# Patient Record
Sex: Male | Born: 1980 | Race: Black or African American | Hispanic: No | Marital: Single | Smoking: Former smoker
Health system: Southern US, Community
[De-identification: ages and names within clinical notes are randomized; demographics above are authoritative.]

## PROBLEM LIST (undated history)

## (undated) DIAGNOSIS — G44009 Cluster headache syndrome, unspecified, not intractable: Secondary | ICD-10-CM

---

## 2001-01-08 ENCOUNTER — Emergency Department (HOSPITAL_COMMUNITY): Admission: EM | Admit: 2001-01-08 | Discharge: 2001-01-08 | Payer: Self-pay | Admitting: Emergency Medicine

## 2005-01-04 ENCOUNTER — Emergency Department (HOSPITAL_COMMUNITY): Admission: EM | Admit: 2005-01-04 | Discharge: 2005-01-05 | Payer: Self-pay | Admitting: Emergency Medicine

## 2007-07-26 ENCOUNTER — Emergency Department: Payer: Self-pay | Admitting: Emergency Medicine

## 2008-09-23 ENCOUNTER — Emergency Department (HOSPITAL_COMMUNITY): Admission: EM | Admit: 2008-09-23 | Discharge: 2008-09-23 | Payer: Self-pay | Admitting: Emergency Medicine

## 2010-08-28 ENCOUNTER — Emergency Department (HOSPITAL_COMMUNITY): Payer: Self-pay

## 2010-08-28 ENCOUNTER — Emergency Department (HOSPITAL_COMMUNITY)
Admission: EM | Admit: 2010-08-28 | Discharge: 2010-08-29 | Disposition: A | Discharge status: Home / Self Care | Payer: Self-pay | Attending: Emergency Medicine | Admitting: Emergency Medicine

## 2010-08-28 DIAGNOSIS — H5789 Other specified disorders of eye and adnexa: Secondary | ICD-10-CM | POA: Insufficient documentation

## 2010-08-29 ENCOUNTER — Encounter (HOSPITAL_COMMUNITY): Payer: Self-pay | Admitting: Radiology

## 2010-09-04 ENCOUNTER — Emergency Department (HOSPITAL_COMMUNITY)
Admission: EM | Admit: 2010-09-04 | Discharge: 2010-09-04 | Disposition: A | Discharge status: Home / Self Care | Payer: Self-pay | Attending: Emergency Medicine | Admitting: Emergency Medicine

## 2010-09-04 DIAGNOSIS — R51 Headache: Secondary | ICD-10-CM | POA: Insufficient documentation

## 2010-09-04 DIAGNOSIS — R42 Dizziness and giddiness: Secondary | ICD-10-CM | POA: Insufficient documentation

## 2010-09-04 DIAGNOSIS — R5383 Other fatigue: Secondary | ICD-10-CM | POA: Insufficient documentation

## 2010-09-04 DIAGNOSIS — R5381 Other malaise: Secondary | ICD-10-CM | POA: Insufficient documentation

## 2013-08-18 ENCOUNTER — Encounter (HOSPITAL_COMMUNITY): Payer: Self-pay | Admitting: Emergency Medicine

## 2013-08-18 ENCOUNTER — Emergency Department (HOSPITAL_COMMUNITY)
Admission: EM | Admit: 2013-08-18 | Discharge: 2013-08-18 | Disposition: A | Discharge status: Home / Self Care | Payer: PRIVATE HEALTH INSURANCE | Attending: Emergency Medicine | Admitting: Emergency Medicine

## 2013-08-18 DIAGNOSIS — M542 Cervicalgia: Secondary | ICD-10-CM | POA: Insufficient documentation

## 2013-08-18 DIAGNOSIS — G44009 Cluster headache syndrome, unspecified, not intractable: Secondary | ICD-10-CM | POA: Insufficient documentation

## 2013-08-18 DIAGNOSIS — Z79899 Other long term (current) drug therapy: Secondary | ICD-10-CM | POA: Insufficient documentation

## 2013-08-18 DIAGNOSIS — F17200 Nicotine dependence, unspecified, uncomplicated: Secondary | ICD-10-CM | POA: Insufficient documentation

## 2013-08-18 HISTORY — DX: Cluster headache syndrome, unspecified, not intractable: G44.009

## 2013-08-18 MED ORDER — KETOROLAC TROMETHAMINE 30 MG/ML IJ SOLN: 30.0000 mg | Freq: Once | INTRAMUSCULAR | Status: DC

## 2013-08-18 MED ORDER — SUMATRIPTAN SUCCINATE 50 MG PO TABS: 50.0000 mg | ORAL_TABLET | Freq: Once | ORAL | Status: DC

## 2013-08-18 MED ORDER — SUMATRIPTAN 20 MG/ACT NA SOLN
20.0000 mg | NASAL | Status: DC | PRN
  Administered 2013-08-18: 20 mg via NASAL
  Filled 2013-08-18 (×2): qty 1

## 2013-08-18 MED ORDER — SUMATRIPTAN SUCCINATE 6 MG/0.5ML ~~LOC~~ SOLN: 6.0000 mg | Freq: Once | SUBCUTANEOUS | Status: DC

## 2013-08-18 MED ORDER — SODIUM CHLORIDE 0.9 % IV BOLUS (SEPSIS)
1000.0000 mL | INTRAVENOUS | Status: AC
Start: 2013-08-18 — End: 2013-08-18
  Administered 2013-08-18: 1000 mL via INTRAVENOUS

## 2013-08-18 NOTE — ED Notes (Signed)
The pt has had  A headache since last Sunday.  He has a hx of headaches.  Nausea no vomiting or diarrhea

## 2013-08-18 NOTE — ED Provider Notes (Signed)
CSN: [id number]     Arrival date & time 08/18/13  1827 History   First MD Initiated Contact with Patient 08/18/13 1957     Chief Complaint  Patient presents with  . Headache   (Consider location/radiation/quality/duration/timing/severity/associated sxs/prior Treatment) HPI Pt is a 33yo male with hx of cluster headaches presenting today with right sided head pain that started 5 days ago, gradually worsening. Pt states pain is pressure like and stabbing at times, worse on right side, 10/10. Pain starts behind both eyes and feels like there is swelling around his eyes. Reports watering of eyes, worse on right side as well as rhinorrhea.  Has tried ibuprofen and BC powders with minimal relief. Also reports nausea but no fever, vomiting or diarrhea. Denies head trauma or syncope. Denies change in vision. Reports similar headaches 2 years ago that lasted up to 2 weeks, another episode last year that lasted about 3 days and now today. Has not f/u with PCP or neurology for headaches. Does not have a PCP. Denies hx of HTN.  Past Medical History  Diagnosis Date  . Cluster headache    History reviewed. No pertinent past surgical history. History reviewed. No pertinent family history. History  Substance Use Topics  . Smoking status: Current Every Day Smoker  . Smokeless tobacco: Not on file  . Alcohol Use: Yes    Review of Systems  Constitutional: Negative for fever and chills.  HENT: Positive for rhinorrhea. Negative for congestion and sinus pressure.   Eyes: Positive for pain ( pressure).       Eye watering  Respiratory: Negative for shortness of breath.   Cardiovascular: Negative for chest pain.  Gastrointestinal: Positive for nausea. Negative for vomiting and abdominal pain.  Musculoskeletal: Positive for neck pain ( left side). Negative for neck stiffness.  Neurological: Positive for headaches. Negative for dizziness and light-headedness.  All other systems reviewed and are  negative.    Allergies  Review of patient's allergies indicates no known allergies.  Home Medications   Current Outpatient Rx  Name  Route  Sig  Dispense  Refill  . SUMAtriptan (IMITREX) 50 MG tablet   Oral   Take 1 tablet (50 mg total) by mouth once. May repeat in 2 hours if headache persists or recurs.   3 tablet   0    BP 134/78  Pulse 78  Temp(Src) 98.2 F (36.8 C)  Resp 12  Ht 5\' 7"  (1.702 m)  Wt 164 lb (74.39 kg)  BMI 25.68 kg/m2  SpO2 97% Physical Exam  Nursing note and vitals reviewed. Constitutional: He is oriented to person, place, and time. He appears well-developed and well-nourished.  Pt lying comfortably in exam bed, NAD.  HENT:  Head: Normocephalic and atraumatic.  Eyes: Conjunctivae and EOM are normal. Pupils are equal, round, and reactive to light. Right eye exhibits no discharge. Left eye exhibits no discharge. No scleral icterus.  Neck: Normal range of motion. Neck supple.  No nuchal rigidity or meningeal signs.  Cardiovascular: Normal rate, regular rhythm and normal heart sounds.   Pulmonary/Chest: Effort normal and breath sounds normal. No respiratory distress. He has no wheezes. He has no rales. He exhibits no tenderness.  Abdominal: Soft. Bowel sounds are normal. He exhibits no distension and no mass. There is no tenderness. There is no rebound and no guarding.  Musculoskeletal: Normal range of motion.  Neurological: He is alert and oriented to person, place, and time. He has normal strength. No cranial nerve deficit or sensory  deficit. Coordination and gait normal. GCS eye subscore is 4. GCS verbal subscore is 5. GCS motor subscore is 6.  CN II-XII grossly in tact. No ataxia. Normal gait. 5/5 grip strength.  Skin: Skin is warm and dry.    ED Course  Procedures (including critical care time) Labs Review Labs Reviewed - No data to display Imaging Review No results found.  EKG Interpretation   None       MDM   1. Cluster headache     Pt with hx of cluster presenting with similar headache. Reviewed medical records which showed negative head CT upon initial encounter for similar headache. Neuro exam: normal.  PERRL.  Tx in ED: fluids, oxygen via [place], imitrex.  HA improved in ED. Pt discharged home. [facility]. Rx: sumatriptan 3 tabs.  Return precautions provided. Pt verbalized understanding and agreement with tx plan.     Junius Finnerrin O'Malley, PA-C 08/18/13 2315

## 2013-08-18 NOTE — ED Provider Notes (Signed)
Medical screening examination/treatment/procedure(s) were performed by non-physician practitioner and as supervising physician I was immediately available for consultation/collaboration.     Geoffery Lyonsouglas Burlon Centrella, MD 08/18/13 [phone]

## 2013-08-18 NOTE — Discharge Instructions (Signed)
Cluster Headache  Cluster headaches are recognized by their pattern of deep, intense head pain. They normally occur on one side of your head, but they may "switch sides" in subsequent episodes. Typically, cluster headaches:   · Are severe in nature.    · Occur repeatedly over weeks to months and are followed by periods of no headaches.    · Can last from 15 minutes to 3 hours.    · Occur at the same time each day, often at night.    · Occur several times a day.  CAUSES  The exact cause of cluster headaches is not known. Alcohol use may be associated with cluster headaches.  SIGNS AND SYMPTOMS   · Severe pain that begins in or around your eye or temple.    · One-sided head pain.    · Feeling sick to your stomach (nauseous).    · Sensitivity to light.    · Runny nose.    · Eye redness, tearing, and nasal stuffiness on the side of your head where you are experiencing pain.    · Sweaty, pale skin of the face.    · Droopy or swollen eyelid.    · Restlessness.  DIAGNOSIS   Cluster headaches are diagnosed based on symptoms and a physical exam. Your health care provider may order a CT scan or an MRI of your head or lab tests to see if your headaches are caused by other medical conditions.   TREATMENT   · Medicines for pain relief and to prevent recurrent attacks. Some people may need a combination of medicines.  · Oxygen for pain relief.    · Biofeedback programs to help reduce headache pain.    It may be helpful to keep a headache diary. This may help you find a trend for what is triggering your headaches. Your health care provider can develop a treatment plan.   HOME CARE INSTRUCTIONS   During cluster periods:   · Follow a regular sleep schedule. Do not vary the amount and time that you sleep from day to day. It is important to stay on the same schedule during a cluster period to help prevent headaches.    · Avoid alcohol.    · Stop smoking if you smoke.    SEEK MEDICAL CARE IF:  · You have any changes from your previous  cluster headaches either in intensity or frequency.    · You are not getting relief from medicines you are taking.    SEEK IMMEDIATE MEDICAL CARE IF:   · You faint.    · You have weakness or numbness, especially on one side of your body or face.    · You have double vision.    · You have nausea or vomiting that is not relieved within several hours.    · You cannot keep your balance or have difficulty talking or walking.    · You have neck pain or stiffness.    · You have a fever.  MAKE SURE YOU:  · Understand these instructions.    · Will watch your condition.    · Will get help right away if you are not doing well or get worse.  Document Released: 07/13/2005 Document Revised: 05/03/2013 Document Reviewed: 02/02/2013  [employer]® Patient Information ©2014 [employer].

## 2013-09-22 ENCOUNTER — Ambulatory Visit: Payer: PRIVATE HEALTH INSURANCE | Admitting: Neurology

## 2013-10-13 ENCOUNTER — Encounter: Payer: Self-pay | Admitting: Neurology

## 2013-10-13 ENCOUNTER — Ambulatory Visit (INDEPENDENT_AMBULATORY_CARE_PROVIDER_SITE_OTHER): Payer: PRIVATE HEALTH INSURANCE | Admitting: Neurology

## 2013-10-13 VITALS — BP 124/68 | HR 74 | Temp 97.6°F | Resp 18 | Ht 68.0 in | Wt 164.7 lb

## 2013-10-13 DIAGNOSIS — G44009 Cluster headache syndrome, unspecified, not intractable: Secondary | ICD-10-CM

## 2013-10-13 NOTE — Progress Notes (Signed)
NEUROLOGY CONSULTATION NOTE  Michael Ramirez MRN: [id number] DOB: 13-Aug-1980  Referring provider: Dr. Geoffery Lyonsouglas Delo, MD (ED referral) Primary care provider: N/A  Reason for consult:  Headache.  HISTORY OF PRESENT ILLNESS: Michael Ramirez is a 33 year old right-handed man with history of cluster headaches presenting for cluster headache.  Records and images were personally reviewed where available.    Onset:  Started 4 years ago. Location:  Right retro-orbital. Quality:  Difficult to describe, but very severe and debilitating. Intensity:  "20"/10. Associated symptoms:  Bilateral conjunctival injection.  Some mild nausea. No vomiting. Duration:  One hour. Frequency:   Several times a day. Usually occurs daily for a period of 3 weeks. Usually occurs during the winter, at the end of the year or beginning of the year. Activity:  Unable to move or do anything.  Past abortive therapy:  O2 in ED (effective).  Sumatriptan po in ED (not sure if effective because it was given at an hour after onset).  Vicodin (feels weird) Past preventative therapy:  none   Current abortive therapy:  Current episodes have resolved.  But typically takes Aleve several times a day. Current preventative therapy:  None.  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 08/28/10 CT of head performed for these headaches.  Personally reviewed and normal.  No prior history of headache.  No family history of headache or cerebral aneurysms.  PAST MEDICAL HISTORY: Past Medical History  Diagnosis Date  . Cluster headache     PAST SURGICAL HISTORY: No past surgical history on file.  MEDICATIONS: Current Outpatient Prescriptions on File Prior to Visit  Medication Sig Dispense Refill  . SUMAtriptan (IMITREX) 50 MG tablet Take 1 tablet (50 mg total) by mouth once. May repeat in 2 hours if headache persists or recurs.  3 tablet  0   No current facility-administered medications on file prior to visit.     ALLERGIES: No Known Allergies  FAMILY HISTORY: Family History  Problem Relation Age of Onset  . Hypertension Father     SOCIAL HISTORY: History   Social History  . Marital Status: Married    Spouse Name: N/A    Number of Children: N/A  . Years of Education: N/A   Occupational History  . Not on file.   Social History Main Topics  . Smoking status: Current Every Day Smoker  . Smokeless tobacco: Not on file  . Alcohol Use: Yes     Comment: Fifth  every other weekend   . Drug Use: Not on file  . Sexual Activity: Yes    Partners: Female    Birth Control/ Protection: Condom   Other Topics Concern  . Not on file   Social History Narrative  . No narrative on file    REVIEW OF SYSTEMS: Constitutional: No fevers, chills, or sweats, no generalized fatigue, change in appetite Eyes: No visual changes, double vision, eye pain Ear, nose and throat: No hearing loss, ear pain, nasal congestion, sore throat Cardiovascular: No chest pain, palpitations Respiratory:  No shortness of breath at rest or with exertion, wheezes GastrointestinaI: No nausea, vomiting, diarrhea, abdominal pain, fecal incontinence Genitourinary:  No dysuria, urinary retention or frequency Musculoskeletal:  No neck pain, back pain Integumentary: No rash, pruritus, skin lesions Neurological: as above Psychiatric: No depression, insomnia, anxiety Endocrine: No palpitations, fatigue, diaphoresis, mood swings, change in appetite, change in weight, increased thirst Hematologic/Lymphatic:  No anemia, purpura, petechiae. Allergic/Immunologic: no itchy/runny eyes, nasal congestion, recent allergic reactions, rashes  PHYSICAL EXAM: Filed Vitals:   10/13/13 0755  BP: 124/68  Pulse: 74  Temp: 97.6 F (36.4 C)  Resp: 18   General: No acute distress Head:  Normocephalic/atraumatic Neck: supple, no paraspinal tenderness, full range of motion Back: No paraspinal tenderness Heart: regular rate and  rhythm Lungs: Clear to auscultation bilaterally. Vascular: No carotid bruits. Neurological Exam: Mental status: alert and oriented to person, place, and time, recent and remote memory intact, fund of knowledge intact, attention and concentration intact, speech fluent and not dysarthric, language intact. Cranial nerves: CN I: not tested CN II: pupils equal, round and reactive to light, visual fields intact, fundi unremarkable, without vessel changes, exudates, hemorrhages or papilledema. CN III, IV, VI:  full range of motion, no nystagmus, no ptosis CN V: facial sensation intact CN VII: upper and lower face symmetric CN VIII: hearing intact CN IX, X: gag intact, uvula midline CN XI: sternocleidomastoid and trapezius muscles intact CN XII: tongue midline Bulk & Tone: normal, no fasciculations. Motor: 5/5 throughout Sensation:  Temperature and vibration intact. Deep Tendon Reflexes: 2+ throughout, toes down. Finger to nose testing: No dysmetria. Heel to shin: No dysmetria. Gait: Normal station and stride. Able to turn and walk in tandem. Romberg negative.  IMPRESSION: Cluster headaches.  PLAN: Since his clusters of last 3 weeks and are unlikely to return for another year, I wouldn't start a preventative medication at this time. If and when another cluster begins, we can try Imitrex 6 mg nasal. I would still like to see him in 6 months for reevaluation.  45 minutes spent with the patient, over 50% spent counseling and coordinating care.  Thank you for allowing me to take part in the care of this patient.  Shon Millet, DO

## 2013-10-13 NOTE — Patient Instructions (Signed)
It sounds like you have cluster headaches.  I wouldn't start any medications at this time, since the headache episode has passed.  If or when you have another episode, call and we can prescribe a medication to stop it.  I would like to see you in 6 months for a re-evaluation.  Call sooner with questions or concerns.

## 2014-04-16 ENCOUNTER — Ambulatory Visit: Payer: PRIVATE HEALTH INSURANCE | Admitting: Neurology

## 2014-04-17 ENCOUNTER — Telehealth: Payer: Self-pay | Admitting: Neurology

## 2014-04-17 NOTE — Telephone Encounter (Signed)
Pt no showed 6 month follow up scheduled for 04/16/14 w/ Dr. Everlena Cooper. No show letter mailed to pt / Sherri S.

## 2016-02-11 ENCOUNTER — Ambulatory Visit: Payer: PRIVATE HEALTH INSURANCE | Admitting: Neurology

## 2016-09-17 ENCOUNTER — Ambulatory Visit (INDEPENDENT_AMBULATORY_CARE_PROVIDER_SITE_OTHER): Payer: PRIVATE HEALTH INSURANCE | Admitting: Family Medicine

## 2016-09-17 ENCOUNTER — Other Ambulatory Visit (HOSPITAL_COMMUNITY)
Admission: RE | Admit: 2016-09-17 | Discharge: 2016-09-17 | Disposition: A | Discharge status: Home / Self Care | Payer: 59 | Source: Ambulatory Visit | Attending: Family Medicine | Admitting: Family Medicine

## 2016-09-17 ENCOUNTER — Encounter: Payer: Self-pay | Admitting: Family Medicine

## 2016-09-17 VITALS — BP 108/78 | HR 60 | Temp 97.8°F | Ht 68.0 in | Wt 177.0 lb

## 2016-09-17 DIAGNOSIS — Z113 Encounter for screening for infections with a predominantly sexual mode of transmission: Secondary | ICD-10-CM | POA: Diagnosis not present

## 2016-09-17 DIAGNOSIS — Z0000 Encounter for general adult medical examination without abnormal findings: Secondary | ICD-10-CM

## 2016-09-17 DIAGNOSIS — Z7689 Persons encountering health services in other specified circumstances: Secondary | ICD-10-CM

## 2016-09-17 DIAGNOSIS — K649 Unspecified hemorrhoids: Secondary | ICD-10-CM | POA: Diagnosis not present

## 2016-09-17 LAB — HEPATIC FUNCTION PANEL
ALK PHOS: 68 U/L (ref 39–117)
ALT: 19 U/L (ref 0–53)
AST: 17 U/L (ref 0–37)
Albumin: 4.4 g/dL (ref 3.5–5.2)
BILIRUBIN DIRECT: 0.1 mg/dL (ref 0.0–0.3)
Total Bilirubin: 0.4 mg/dL (ref 0.2–1.2)
Total Protein: 6.8 g/dL (ref 6.0–8.3)

## 2016-09-17 LAB — CBC WITH DIFFERENTIAL/PLATELET
BASOS ABS: 0 10*3/uL (ref 0.0–0.1)
Basophils Relative: 0.6 % (ref 0.0–3.0)
EOS ABS: 0.2 10*3/uL (ref 0.0–0.7)
Eosinophils Relative: 3.8 % (ref 0.0–5.0)
HEMATOCRIT: 44.7 % (ref 39.0–52.0)
Hemoglobin: 15.1 g/dL (ref 13.0–17.0)
LYMPHS ABS: 2.1 10*3/uL (ref 0.7–4.0)
LYMPHS PCT: 45.7 % (ref 12.0–46.0)
MCHC: 33.7 g/dL (ref 30.0–36.0)
MCV: 81.1 fl (ref 78.0–100.0)
Monocytes Absolute: 0.6 10*3/uL (ref 0.1–1.0)
Monocytes Relative: 12.6 % — ABNORMAL HIGH (ref 3.0–12.0)
NEUTROS ABS: 1.7 10*3/uL (ref 1.4–7.7)
NEUTROS PCT: 37.3 % — AB (ref 43.0–77.0)
PLATELETS: 242 10*3/uL (ref 150.0–400.0)
RBC: 5.51 Mil/uL (ref 4.22–5.81)
RDW: 14.6 % (ref 11.5–15.5)
WBC: 4.6 10*3/uL (ref 4.0–10.5)

## 2016-09-17 LAB — LIPID PANEL
CHOL/HDL RATIO: 5
Cholesterol: 231 mg/dL — ABNORMAL HIGH (ref 0–200)
HDL: 42.3 mg/dL (ref >39.00)
LDL CALC: 163 mg/dL — AB (ref 0–99)
NonHDL: 188.2
TRIGLYCERIDES: 126 mg/dL (ref 0.0–149.0)
VLDL: 25.2 mg/dL (ref 0.0–40.0)

## 2016-09-17 LAB — BASIC METABOLIC PANEL
BUN: 14 mg/dL (ref 6–23)
CALCIUM: 9.4 mg/dL (ref 8.4–10.5)
CO2: 27 meq/L (ref 19–32)
CREATININE: 1.35 mg/dL (ref 0.40–1.50)
Chloride: 105 mEq/L (ref 96–112)
GFR: 77.4 mL/min (ref >60.00)
Glucose, Bld: 89 mg/dL (ref 70–99)
Potassium: 4.2 mEq/L (ref 3.5–5.1)
SODIUM: 139 meq/L (ref 135–145)

## 2016-09-17 LAB — POCT URINALYSIS DIPSTICK
BILIRUBIN UA: NEGATIVE
GLUCOSE UA: NEGATIVE
KETONES UA: NEGATIVE
Nitrite, UA: NEGATIVE
Urobilinogen, UA: 0.2
pH, UA: 5.5

## 2016-09-17 LAB — TSH: TSH: 1.13 u[IU]/mL (ref 0.35–4.50)

## 2016-09-17 NOTE — Progress Notes (Signed)
Pre visit review using our clinic review tool, if applicable. No additional management support is needed unless otherwise documented below in the visit note. 

## 2016-09-17 NOTE — Progress Notes (Signed)
Patient ID: Michael Ramirez, male   DOB: 10/26/1980, 36 y.o.   MRN: [id number]  Patient presents to clinic today to establish and receive routine care. He reports that is has "been a long time" since he has seen a provider for a physical   No acute symptoms noted today. He is a former smoker who quit 11/25/2015 an he reports drinking a "fifth of liquor" every week which is mainly contained to the weekend.  Reviewed health maintenance protocols including  colonoscopy, and reviewed appropriate screening labs.  Immunization history was reviewed as well as his current medications and allergies.   Hemorrhoids: History of hemorrhoids that are not causing any pain today.  He reports last flare approximately 1 year ago which he treated with witch hazel that provided excellent benefit.  He denies N/V, constipation, loose stools, melena, or narrow stool caliber. He also reports eating a diet that consists of mainly fried foods with limited fiber intake. Reports BM frequency every other day.  He is requesting screening for STIs. He denies any new partners and reports condom use. He further denies any dysuria, hematuria, or penile discharge today.   Health Maintenance: Dental -- Every 6 months Vision -- Yearly Immunizations -- Declined influenza vaccine; Recommend tetanus   Past Medical History:  Diagnosis Date  . Cluster headache     History reviewed. No pertinent surgical history.  No current outpatient prescriptions on file prior to visit.   No current facility-administered medications on file prior to visit.     No Known Allergies  Family History  Problem Relation Age of Onset  . Hypertension Father   . Heart disease Paternal Grandmother     Social History   Social History  . Marital status: Single    Spouse name: N/A  . Number of children: N/A  . Years of education: N/A   Occupational History  . Not on file.   Social History Main Topics  . Smoking status: Former Smoker    Quit  date: 11/25/2015  . Smokeless tobacco: Former NeurosurgeonUser  . Alcohol use Yes     Comment: Fifth  every other weekend   . Drug use: No  . Sexual activity: Yes    Partners: Female    Birth control/ protection: Condom   Other Topics Concern  . Not on file   Social History Narrative  . No narrative on file    Review of Systems  Constitutional: Negative for chills, diaphoresis and fever.  HENT: Negative for congestion, ear pain, sinus pain and sore throat.   Eyes: Negative for blurred vision and double vision.  Respiratory: Negative for cough, hemoptysis, sputum production, shortness of breath and wheezing.   Cardiovascular: Negative for chest pain and palpitations.  Gastrointestinal: Negative for abdominal pain, blood in stool, constipation, diarrhea, heartburn, melena, nausea and vomiting.  Genitourinary: Negative for dysuria, flank pain, frequency, hematuria and urgency.  Musculoskeletal: Negative for back pain, joint pain and myalgias.  Skin: Negative for rash.  Neurological: Negative for dizziness, tingling and headaches.  Endo/Heme/Allergies: Negative for polydipsia. Does not bruise/bleed easily.  Psychiatric/Behavioral:       Denies depressed or anxious mood    BP 108/78 (BP Location: Left Arm, Patient Position: Sitting, Cuff Size: Normal)   Pulse 60   Temp 97.8 F (36.6 C) (Oral)   Ht 5\' 8"  (1.727 m)   Wt 177 lb (80.3 kg)   SpO2 98%   BMI 26.91 kg/m   Physical Exam  Physical Exam  Constitutional:  He is oriented to person, place, and time. He appears well-developed and well-nourished. No distress.  HENT:  Head: Normocephalic and atraumatic.  Right Ear: Tympanic membrane and ear canal normal.  Left Ear: Tympanic membrane and ear canal normal.  Mouth/Throat: Oropharynx is clear and moist.  Eyes: Pupils are equal, round, and reactive to light. No scleral icterus.  Neck: Normal range of motion. No thyromegaly present.  Cardiovascular: Normal rate and regular rhythm.   No  murmur heard. Pulmonary/Chest: Effort normal and breath sounds normal. No respiratory distress. He has no wheezes. He has no rales. He exhibits no tenderness.  Abdominal: Soft. Bowel sounds are normal. He exhibits no distension and no mass. There is no tenderness. There is no rebound and no guarding.  Musculoskeletal: He exhibits no edema.  Lymphadenopathy:    He has no cervical adenopathy.  Neurological: He is alert and oriented to person, place, and time. He has normal reflexes. He exhibits normal muscle tone. Coordination normal.  Skin: Skin is warm and dry.  Psychiatric: He has a normal mood and affect. His behavior is normal. Judgment and thought content normal.   He denies GU exam today  Assessment/Plan: 1. Routine general medical examination at a health care facility 36 y.o. male presenting for annual physical.  Health Maintenance counseling: 1. Anticipatory guidance: Patient counseled regarding regular dental exams, eye exams, wearing seatbelts.  2. Risk factor reduction:  Advised patient of need for regular exercise and diet rich and fruits and vegetables to reduce risk of heart attack and stroke.  3. Immunizations/screenings/ancillary studies. He declined influenza and Tdap today.  There is no immunization history on file for this patient. Health Maintenance Due  Topic Date Due  . HIV Screening  10/30/1996  . TETANUS/TDAP  10/30/2000  . INFLUENZA VACCINE  02/25/2016   4. Prostate cancer screening-Denied GU exam today  5. Colon cancer screening - Not needed; denies GU exam today 6. Skin cancer screening- No suspicious lesions noted on arms, legs, back, or chest.  - Basic metabolic panel - CBC with Differential/Platelet - Hepatic function panel - TSH - Lipid panel - POCT urinalysis dipstick  2. Routine screening for STI (sexually transmitted infection) No symptoms noted; denies new sexual partners and reports condom use. Denies GU exam today. Will provide lab screening  tests today. Empiric treatment denied today.  - Urine cytology ancillary only - HIV antibody - RPR - Hepatitis C antibody  3. Encounter to establish care We reviewed the PMH, PSH, FH, SH, Meds and Allergies. --We addressed current concerns per orders and patient instructions. -We have asked for records for pertinent exams, studies, vaccines and notes from previous providers. -We have advised patient to follow up per instructions below.   4. Hemorrhoids, unspecified hemorrhoid type Asymptomatic today; denies visual exam. Advised to follow up if a flare occurs and evaluation and treatment can be provided.  Follow up in one year or sooner if needed for symptoms or lab findings.  Roddie Mc, FNP-C

## 2016-09-17 NOTE — Patient Instructions (Signed)
It was a pleasure to meet you today! We have ordered labs or studies at this visit. It can take up to 1-2 weeks for results and processing. IF results require follow up or explanation, we will call you with instructions. Clinically stable results will be released to your [facility]. If you have not heard from [place] or cannot find your results in [facility] in 2 weeks please contact our office at [phone].  If you are not yet signed up for [facility], please consider signing up   Health Maintenance, Male A healthy lifestyle and preventative care can promote health and wellness.  Maintain regular health, dental, and eye exams.  Eat a healthy diet. Foods like vegetables, fruits, whole grains, low-fat dairy products, and lean protein foods contain the nutrients you need and are low in calories. Decrease your intake of foods high in solid fats, added sugars, and salt. Get information about a proper diet from your health care provider, if necessary.  Regular physical exercise is one of the most important things you can do for your health. Most adults should get at least 150 minutes of moderate-intensity exercise (any activity that increases your heart rate and causes you to sweat) each week. In addition, most adults need muscle-strengthening exercises on 2 or more days a week.   Maintain a healthy weight. The body mass index (BMI) is a screening tool to identify possible weight problems. It provides an estimate of body fat based on height and weight. Your health care provider can find your BMI and can help you achieve or maintain a healthy weight. For males 20 years and older:  A BMI below 18.5 is considered underweight.  A BMI of 18.5 to 24.9 is normal.  A BMI of 25 to 29.9 is considered overweight.  A BMI of 30 and above is considered obese.  Maintain normal blood lipids and cholesterol by exercising and minimizing your intake of saturated fat. Eat a balanced diet with plenty of fruits and vegetables.  Blood tests for lipids and cholesterol should begin at age 93 and be repeated every 5 years. If your lipid or cholesterol levels are high, you are over age 44, or you are at high risk for heart disease, you may need your cholesterol levels checked more frequently.Ongoing high lipid and cholesterol levels should be treated with medicines if diet and exercise are not working.  If you smoke, find out from your health care provider how to quit. If you do not use tobacco, do not start.  Lung cancer screening is recommended for adults aged 55-80 years who are at high risk for developing lung cancer because of a history of smoking. A yearly low-dose CT scan of the lungs is recommended for people who have at least a 30-pack-year history of smoking and are current smokers or have quit within the past 15 years. A pack year of smoking is smoking an average of 1 pack of cigarettes a day for 1 year (for example, a 30-pack-year history of smoking could mean smoking 1 pack a day for 30 years or 2 packs a day for 15 years). Yearly screening should continue until the smoker has stopped smoking for at least 15 years. Yearly screening should be stopped for people who develop a health problem that would prevent them from having lung cancer treatment.  If you choose to drink alcohol, do not have more than 2 drinks per day. One drink is considered to be 12 oz (360 mL) of beer, 5 oz (150 mL)  of wine, or 1.5 oz (45 mL) of liquor.  Avoid the use of street drugs. Do not share needles with anyone. Ask for help if you need support or instructions about stopping the use of drugs.  High blood pressure causes heart disease and increases the risk of stroke. High blood pressure is more likely to develop in:  People who have blood pressure in the end of the normal range (100-139/85-89 mm Hg).  People who are overweight or obese.  People who are African American.  If you are 3818-35 years of age, have your blood pressure checked  every 3-5 years. If you are 35 years of age or older, have your blood pressure checked every year. You should have your blood pressure measured twice-once when you are at a hospital or clinic, and once when you are not at a hospital or clinic. Record the average of the two measurements. To check your blood pressure when you are not at a hospital or clinic, you can use:  An automated blood pressure machine at a pharmacy.  A home blood pressure monitor.  If you are 3145-35 years old, ask your health care provider if you should take aspirin to prevent heart disease.  Diabetes screening involves taking a blood sample to check your fasting blood sugar level. This should be done once every 3 years after age 35 if you are at a normal weight and without risk factors for diabetes. Testing should be considered at a younger age or be carried out more frequently if you are overweight and have at least 1 risk factor for diabetes.  Colorectal cancer can be detected and often prevented. Most routine colorectal cancer screening begins at the age of 35 and continues through age 275. However, your health care provider may recommend screening at an earlier age if you have risk factors for colon cancer. On a yearly basis, your health care provider may provide home test kits to check for hidden blood in the stool. A small camera at the end of a tube may be used to directly examine the colon (sigmoidoscopy or colonoscopy) to detect the earliest forms of colorectal cancer. Talk to your health care provider about this at age 550 when routine screening begins. A direct exam of the colon should be repeated every 5-10 years through age 35, unless early forms of precancerous polyps or small growths are found.  People who are at an increased risk for hepatitis B should be screened for this virus. You are considered at high risk for hepatitis B if:  You were born in a country where hepatitis B occurs often. Talk with your health care  provider about which countries are considered high risk.  Your parents were born in a high-risk country and you have not received a shot to protect against hepatitis B (hepatitis B vaccine).  You have HIV or AIDS.  You use needles to inject street drugs.  You live with, or have sex with, someone who has hepatitis B.  You are a man who has sex with other men (MSM).  You get hemodialysis treatment.  You take certain medicines for conditions like cancer, organ transplantation, and autoimmune conditions.  Hepatitis C blood testing is recommended for all people born from [id number] through 1965 and any individual with known risk factors for hepatitis C.  Healthy men should no longer receive prostate-specific antigen (PSA) blood tests as part of routine cancer screening. Talk to your health care provider about prostate cancer screening.  Testicular cancer  screening is not recommended for adolescents or adult males who have no symptoms. Screening includes self-exam, a health care provider exam, and other screening tests. Consult with your health care provider about any symptoms you have or any concerns you have about testicular cancer.  Practice safe sex. Use condoms and avoid high-risk sexual practices to reduce the spread of sexually transmitted infections (STIs).  You should be screened for STIs, including gonorrhea and chlamydia if:  You are sexually active and are younger than 24 years.  You are older than 24 years, and your health care provider tells you that you are at risk for this type of infection.  Your sexual activity has changed since you were last screened, and you are at an increased risk for chlamydia or gonorrhea. Ask your health care provider if you are at risk.  If you are at risk of being infected with HIV, it is recommended that you take a prescription medicine daily to prevent HIV infection. This is called pre-exposure prophylaxis (PrEP). You are considered at risk if:  You  are a man who has sex with other men (MSM).  You are a heterosexual man who is sexually active with multiple partners.  You take drugs by injection.  You are sexually active with a partner who has HIV.  Talk with your health care provider about whether you are at high risk of being infected with HIV. If you choose to begin PrEP, you should first be tested for HIV. You should then be tested every 3 months for as long as you are taking PrEP.  Use sunscreen. Apply sunscreen liberally and repeatedly throughout the day. You should seek shade when your shadow is shorter than you. Protect yourself by wearing long sleeves, pants, a wide-brimmed hat, and sunglasses year round whenever you are outdoors.  Tell your health care provider of new moles or changes in moles, especially if there is a change in shape or color. Also, tell your health care provider if a mole is larger than the size of a pencil eraser.  A one-time screening for abdominal aortic aneurysm (AAA) and surgical repair of large AAAs by ultrasound is recommended for men aged 65-75 years who are current or former smokers.  Stay current with your vaccines (immunizations). This information is not intended to replace advice given to you by your health care provider. Make sure you discuss any questions you have with your health care provider. Document Released: 01/09/2008 Document Revised: 08/03/2014 Document Reviewed: 04/16/2015 Elsevier Interactive Patient Education  2017 [employer].

## 2016-09-18 LAB — URINE CYTOLOGY ANCILLARY ONLY
Chlamydia: NEGATIVE
Neisseria Gonorrhea: NEGATIVE
TRICH (WINDOWPATH): NEGATIVE

## 2016-09-18 LAB — HEPATITIS C ANTIBODY: HCV AB: NEGATIVE

## 2016-09-18 LAB — RPR

## 2016-09-18 LAB — HIV ANTIBODY (ROUTINE TESTING W REFLEX): HIV 1&2 Ab, 4th Generation: NONREACTIVE

## 2016-09-21 LAB — URINE CYTOLOGY ANCILLARY ONLY: BACTERIAL VAGINITIS: NEGATIVE

## 2017-08-11 DIAGNOSIS — H18212 Corneal edema secondary to contact lens, left eye: Secondary | ICD-10-CM | POA: Diagnosis not present

## 2017-08-13 DIAGNOSIS — H18212 Corneal edema secondary to contact lens, left eye: Secondary | ICD-10-CM | POA: Diagnosis not present

## 2017-11-01 ENCOUNTER — Ambulatory Visit: Payer: BLUE CROSS/BLUE SHIELD | Admitting: Family Medicine

## 2017-11-01 ENCOUNTER — Encounter: Payer: Self-pay | Admitting: Family Medicine

## 2017-11-01 VITALS — BP 130/68 | HR 82 | Temp 97.9°F | Ht 67.75 in | Wt 170.0 lb

## 2017-11-01 DIAGNOSIS — Z0000 Encounter for general adult medical examination without abnormal findings: Secondary | ICD-10-CM | POA: Diagnosis not present

## 2017-11-01 DIAGNOSIS — Z131 Encounter for screening for diabetes mellitus: Secondary | ICD-10-CM

## 2017-11-01 DIAGNOSIS — Z13220 Encounter for screening for lipoid disorders: Secondary | ICD-10-CM | POA: Diagnosis not present

## 2017-11-01 LAB — COMPREHENSIVE METABOLIC PANEL
ALT: 26 U/L (ref 0–53)
AST: 32 U/L (ref 0–37)
Albumin: 4.2 g/dL (ref 3.5–5.2)
Alkaline Phosphatase: 77 U/L (ref 39–117)
BILIRUBIN TOTAL: 0.4 mg/dL (ref 0.2–1.2)
BUN: 14 mg/dL (ref 6–23)
CO2: 26 meq/L (ref 19–32)
Calcium: 9.6 mg/dL (ref 8.4–10.5)
Chloride: 103 mEq/L (ref 96–112)
Creatinine, Ser: 1.32 mg/dL (ref 0.40–1.50)
GFR: 78.92 mL/min (ref >60.00)
GLUCOSE: 94 mg/dL (ref 70–99)
Potassium: 3.9 mEq/L (ref 3.5–5.1)
Sodium: 140 mEq/L (ref 135–145)
Total Protein: 7.1 g/dL (ref 6.0–8.3)

## 2017-11-01 LAB — CBC
HCT: 44.3 % (ref 39.0–52.0)
Hemoglobin: 15.1 g/dL (ref 13.0–17.0)
MCHC: 34 g/dL (ref 30.0–36.0)
MCV: 79.8 fl (ref 78.0–100.0)
PLATELETS: 265 10*3/uL (ref 150.0–400.0)
RBC: 5.55 Mil/uL (ref 4.22–5.81)
RDW: 14.5 % (ref 11.5–15.5)
WBC: 4.4 10*3/uL (ref 4.0–10.5)

## 2017-11-01 LAB — LDL CHOLESTEROL, DIRECT: LDL DIRECT: 155 mg/dL

## 2017-11-01 LAB — HEMOGLOBIN A1C: Hgb A1c MFr Bld: 6 % (ref 4.6–6.5)

## 2017-11-01 LAB — LIPID PANEL
CHOL/HDL RATIO: 5
Cholesterol: 229 mg/dL — ABNORMAL HIGH (ref 0–200)
HDL: 47.1 mg/dL (ref >39.00)
NONHDL: 182.28
Triglycerides: 264 mg/dL — ABNORMAL HIGH (ref 0.0–149.0)
VLDL: 52.8 mg/dL — ABNORMAL HIGH (ref 0.0–40.0)

## 2017-11-01 NOTE — Patient Instructions (Addendum)
Preventive Care 18-39 Years, Male Preventive care refers to lifestyle choices and visits with your health care provider that can promote health and wellness. What does preventive care include?  A yearly physical exam. This is also called an annual well check.  Dental exams once or twice a year.  Routine eye exams. Ask your health care provider how often you should have your eyes checked.  Personal lifestyle choices, including: ? Daily care of your teeth and gums. ? Regular physical activity. ? Eating a healthy diet. ? Avoiding tobacco and drug use. ? Limiting alcohol use. ? Practicing safe sex. What happens during an annual well check? The services and screenings done by your health care provider during your annual well check will depend on your age, overall health, lifestyle risk factors, and family history of disease. Counseling Your health care provider may ask you questions about your:  Alcohol use.  Tobacco use.  Drug use.  Emotional well-being.  Home and relationship well-being.  Sexual activity.  Eating habits.  Work and work Statistician.  Screening You may have the following tests or measurements:  Height, weight, and BMI.  Blood pressure.  Lipid and cholesterol levels. These may be checked every 5 years starting at age 56.  Diabetes screening. This is done by checking your blood sugar (glucose) after you have not eaten for a while (fasting).  Skin check.  Hepatitis C blood test.  Hepatitis B blood test.  Sexually transmitted disease (STD) testing.  Discuss your test results, treatment options, and if necessary, the need for more tests with your health care provider. Vaccines Your health care provider may recommend certain vaccines, such as:  Influenza vaccine. This is recommended every year.  Tetanus, diphtheria, and acellular pertussis (Tdap, Td) vaccine. You may need a Td booster every 10 years.  Varicella vaccine. You may need this if you  have not been vaccinated.  HPV vaccine. If you are 10 or younger, you may need three doses over 6 months.  Measles, mumps, and rubella (MMR) vaccine. You may need at least one dose of MMR.You may also need a second dose.  Pneumococcal 13-valent conjugate (PCV13) vaccine. You may need this if you have certain conditions and have not been vaccinated.  Pneumococcal polysaccharide (PPSV23) vaccine. You may need one or two doses if you smoke cigarettes or if you have certain conditions.  Meningococcal vaccine. One dose is recommended if you are age 74-21 years and a first-year college student living in a residence hall, or if you have one of several medical conditions. You may also need additional booster doses.  Hepatitis A vaccine. You may need this if you have certain conditions or if you travel or work in places where you may be exposed to hepatitis A.  Hepatitis B vaccine. You may need this if you have certain conditions or if you travel or work in places where you may be exposed to hepatitis B.  Haemophilus influenzae type b (Hib) vaccine. You may need this if you have certain risk factors.  Talk to your health care provider about which screenings and vaccines you need and how often you need them. This information is not intended to replace advice given to you by your health care provider. Make sure you discuss any questions you have with your health care provider. Document Released: 09/08/2001 Document Revised: 04/01/2016 Document Reviewed: 05/14/2015 Elsevier Interactive Patient Education  2018 [employer].  Preventing Hypertension Hypertension, commonly called high blood pressure, is when the force of blood  pumping through the arteries is too strong. Arteries are blood vessels that carry blood from the heart throughout the body. Over time, hypertension can damage the arteries and decrease blood flow to important parts of the body, including the brain, heart, and kidneys. Often,  hypertension does not cause symptoms until blood pressure is very high. For this reason, it is important to have your blood pressure checked on a regular basis. Hypertension can often be prevented with diet and lifestyle changes. If you already have hypertension, you can control it with diet and lifestyle changes, as well as medicine. What nutrition changes can be made? Maintain a healthy diet. This includes:  Eating less salt (sodium). Ask your health care provider how much sodium is safe for you to have. The general recommendation is to consume less than 1 tsp (2,300 mg) of sodium a day. ? Do not add salt to your food. ? Choose low-sodium options when grocery shopping and eating out.  Limiting fats in your diet. You can do this by eating low-fat or fat-free dairy products and by eating less red meat.  Eating more fruits, vegetables, and whole grains. Make a goal to eat: ? 1-2 cups of fresh fruits and vegetables each day. ? 3-4 servings of whole grains each day.  Avoiding foods and beverages that have added sugars.  Eating fish that contain healthy fats (omega-3 fatty acids), such as mackerel or salmon.  If you need help putting together a healthy eating plan, try the DASH diet. This diet is high in fruits, vegetables, and whole grains. It is low in sodium, red meat, and added sugars. DASH stands for Dietary Approaches to Stop Hypertension. What lifestyle changes can be made?  Lose weight if you are overweight. Losing just 3?5% of your body weight can help prevent or control hypertension. ? For example, if your present weight is 200 lb (91 kg), a loss of 3-5% of your weight means losing 6-10 lb (2.7-4.5 kg). ? Ask your health care provider to help you with a diet and exercise plan to safely lose weight.  Get enough exercise. Do at least 150 minutes of moderate-intensity exercise each week. ? You could do this in short exercise sessions several times a day, or you could do longer exercise  sessions a few times a week. For example, you could take a brisk 10-minute walk or bike ride, 3 times a day, for 5 days a week.  Find ways to reduce stress, such as exercising, meditating, listening to music, or taking a yoga class. If you need help reducing stress, ask your health care provider.  Do not smoke. This includes e-cigarettes. Chemicals in tobacco and nicotine products raise your blood pressure each time you smoke. If you need help quitting, ask your health care provider.  Avoid alcohol. If you drink alcohol, limit alcohol intake to no more than 1 drink a day for nonpregnant women and 2 drinks a day for men. One drink equals 12 oz of beer, 5 oz of wine, or 1 oz of hard liquor. Why are these changes important? Diet and lifestyle changes can help you prevent hypertension, and they may make you feel better overall and improve your quality of life. If you have hypertension, making these changes will help you control it and help prevent major complications, such as:  Hardening and narrowing of arteries that supply blood to: ? Your heart. This can cause a heart attack. ? Your brain. This can cause a stroke. ? Your kidneys. This  can cause kidney failure.  Stress on your heart muscle, which can cause heart failure.  What can I do to lower my risk?  Work with your health care provider to make a hypertension prevention plan that works for you. Follow your plan and keep all follow-up visits as told by your health care provider.  Learn how to check your blood pressure at home. Make sure that you know your personal target blood pressure, as told by your health care provider. How is this treated? In addition to diet and lifestyle changes, your health care provider may recommend medicines to help lower your blood pressure. You may need to try a few different medicines to find what works best for you. You also may need to take more than one medicine. Take over-the-counter and prescription  medicines only as told by your health care provider. Where to find support: Your health care provider can help you prevent hypertension and help you keep your blood pressure at a healthy level. Your local hospital or your community may also provide support services and prevention programs. The American Heart Association offers an online support network at: CheapBootlegs.com.cy Where to find more information: Learn more about hypertension from:  National Heart, Lung, and Blood Institute: ElectronicHangman.is  Centers for Disease Control and Prevention: https://ingram.com/  American Academy of Family Physicians: http://familydoctor.org/familydoctor/en/diseases-conditions/high-blood-pressure.printerview.all.html  Learn more about the DASH diet from:  [place], [facility], and [place]: https://www.reyes.com/  Contact a health care provider if:  You think you are having a reaction to medicines you have taken.  You have recurrent headaches or feel dizzy.  You have swelling in your ankles.  You have trouble with your vision. Summary  Hypertension often does not cause any symptoms until blood pressure is very high. It is important to get your blood pressure checked regularly.  Diet and lifestyle changes are the most important steps in preventing hypertension.  By keeping your blood pressure in a healthy range, you can prevent complications like heart attack, heart failure, stroke, and kidney failure.  Work with your health care provider to make a hypertension prevention plan that works for you. This information is not intended to replace advice given to you by your health care provider. Make sure you discuss any questions you have with your health care provider. Document Released: 07/28/2015 Document Revised: 03/23/2016 Document Reviewed: 03/23/2016 Elsevier Interactive Patient Education   [employer].

## 2017-11-01 NOTE — Progress Notes (Signed)
Subjective:     Michael Ramirez is a 37 y.o. male and is here for a comprehensive physical exam. The patient reports no problems.  Pt states he is trying to exercise more by getting on the treadmill and lifting weights.  Pt has been told he had "high cholesterol" in the past.  Pt is drinking 2-3 bottles of water daily.  Pt may drink a pint of liquor on the wknd.  Pt is a former smoker.  He quit 11/25/15.  Pt is trying to eat more salads.  Pt mentions he is "slew foot" and occasionally has right ankle pain at the end of the day.  Social History   Socioeconomic History  . Marital status: Single    Spouse name: Not on file  . Number of children: Not on file  . Years of education: Not on file  . Highest education level: Not on file  Occupational History  . Occupation: Midwife: [employer]  Social Needs  . Financial resource strain: Not on file  . Food insecurity:    Worry: Not on file    Inability: Not on file  . Transportation needs:    Medical: Not on file    Non-medical: Not on file  Tobacco Use  . Smoking status: Former Smoker    Last attempt to quit: 11/25/2015    Years since quitting: 1.9  . Smokeless tobacco: Former Engineer, water and Sexual Activity  . Alcohol use: Yes    Alcohol/week: 3.6 oz    Types: 6 Shots of liquor per week    Comment: Fifth  every other weekend   . Drug use: No  . Sexual activity: Yes    Partners: Female    Birth control/protection: Condom  Lifestyle  . Physical activity:    Days per week: Not on file    Minutes per session: Not on file  . Stress: Not on file  Relationships  . Social connections:    Talks on phone: Not on file    Gets together: Not on file    Attends religious service: Not on file    Active member of club or organization: Not on file    Attends meetings of clubs or organizations: Not on file    Relationship status: Not on file  . Intimate partner violence:    Fear of current or ex partner: Not on  file    Emotionally abused: Not on file    Physically abused: Not on file    Forced sexual activity: Not on file  Other Topics Concern  . Not on file  Social History Narrative  . Not on file   Health Maintenance  Topic Date Due  . TETANUS/TDAP  10/30/2000  . INFLUENZA VACCINE  02/24/2018  . HIV Screening  Completed    The following portions of the patient's history were reviewed and updated as appropriate: allergies, current medications, past family history, past medical history, past social history, past surgical history and problem list.  Review of Systems A comprehensive review of systems was negative.   Objective:    BP 130/68 (BP Location: Left Arm, Patient Position: Sitting, Cuff Size: Normal)   Pulse 82   Temp 97.9 F (36.6 C) (Oral)   Ht 5' 7.75" (1.721 m)   Wt 170 lb (77.1 kg)   SpO2 95%   BMI 26.04 kg/m  General appearance: alert, cooperative, appears stated age and no distress Head: Normocephalic, without obvious abnormality, atraumatic Eyes: conjunctivae/corneas  clear. PERRL, EOM's intact. Fundi benign. Ears: normal TM's and external ear canals both ears Nose: Nares normal. Septum midline. Mucosa normal. No drainage or sinus tenderness. Throat: lips, mucosa, and tongue normal; teeth and gums normal Neck: no adenopathy, no carotid bruit, no JVD, supple, symmetrical, trachea midline and thyroid not enlarged, symmetric, no tenderness/mass/nodules Lungs: clear to auscultation bilaterally Heart: regular rate and rhythm, S1, S2 normal, no murmur, click, rub or gallop Abdomen: soft, non-tender; bowel sounds normal; no masses,  no organomegaly Extremities: extremities normal, atraumatic, no cyanosis or edema Skin: Skin color, texture, turgor normal. No rashes or lesions Neurologic: Alert and oriented X 3, normal strength and tone. Normal symmetric reflexes. Normal coordination and gait    Assessment:    Healthy male exam.     Plan:    Respiratory guidance given  including wearing seatbelts, smoke detectors in the home, increasing physical activity, increasing p.o. intake of water, decreasing intake of alcohol. -We will obtain labs this visit -Immunizations up-to-date See After Visit Summary for Counseling Recommendations    F/u prn.  Abbe AmsterdamShannon Jary Louvier, MD

## 2017-11-04 ENCOUNTER — Encounter: Payer: Self-pay | Admitting: Family Medicine

## 2017-11-08 DIAGNOSIS — H33302 Unspecified retinal break, left eye: Secondary | ICD-10-CM | POA: Diagnosis not present

## 2017-11-19 DIAGNOSIS — H33331 Multiple defects of retina without detachment, right eye: Secondary | ICD-10-CM | POA: Diagnosis not present

## 2018-05-03 ENCOUNTER — Other Ambulatory Visit: Payer: Self-pay

## 2019-05-01 DIAGNOSIS — H40013 Open angle with borderline findings, low risk, bilateral: Secondary | ICD-10-CM | POA: Diagnosis not present

## 2019-08-23 DIAGNOSIS — Q6689 Other  specified congenital deformities of feet: Secondary | ICD-10-CM | POA: Diagnosis not present

## 2019-08-23 DIAGNOSIS — M65872 Other synovitis and tenosynovitis, left ankle and foot: Secondary | ICD-10-CM | POA: Diagnosis not present

## 2019-08-23 DIAGNOSIS — M65861 Other synovitis and tenosynovitis, right lower leg: Secondary | ICD-10-CM | POA: Diagnosis not present

## 2019-08-23 DIAGNOSIS — M79671 Pain in right foot: Secondary | ICD-10-CM | POA: Diagnosis not present

## 2019-09-25 DIAGNOSIS — M65872 Other synovitis and tenosynovitis, left ankle and foot: Secondary | ICD-10-CM | POA: Diagnosis not present

## 2019-09-25 DIAGNOSIS — Q6689 Other  specified congenital deformities of feet: Secondary | ICD-10-CM | POA: Diagnosis not present

## 2019-09-25 DIAGNOSIS — M65861 Other synovitis and tenosynovitis, right lower leg: Secondary | ICD-10-CM | POA: Diagnosis not present

## 2019-09-25 DIAGNOSIS — M25571 Pain in right ankle and joints of right foot: Secondary | ICD-10-CM | POA: Diagnosis not present

## 2019-09-25 DIAGNOSIS — M25572 Pain in left ankle and joints of left foot: Secondary | ICD-10-CM | POA: Diagnosis not present

## 2019-10-04 ENCOUNTER — Other Ambulatory Visit: Payer: Self-pay

## 2019-10-05 ENCOUNTER — Encounter: Payer: BC Managed Care – PPO | Admitting: Family Medicine

## 2019-10-24 ENCOUNTER — Other Ambulatory Visit: Payer: Self-pay

## 2019-10-25 ENCOUNTER — Ambulatory Visit (INDEPENDENT_AMBULATORY_CARE_PROVIDER_SITE_OTHER): Payer: BC Managed Care – PPO | Admitting: Family Medicine

## 2019-10-25 ENCOUNTER — Encounter: Payer: Self-pay | Admitting: Family Medicine

## 2019-10-25 ENCOUNTER — Other Ambulatory Visit (HOSPITAL_COMMUNITY)
Admission: RE | Admit: 2019-10-25 | Discharge: 2019-10-25 | Disposition: A | Discharge status: Home / Self Care | Payer: BC Managed Care – PPO | Source: Ambulatory Visit | Attending: Family Medicine | Admitting: Family Medicine

## 2019-10-25 VITALS — BP 110/80 | HR 66 | Temp 96.4°F | Wt 173.4 lb

## 2019-10-25 DIAGNOSIS — R39198 Other difficulties with micturition: Secondary | ICD-10-CM | POA: Diagnosis not present

## 2019-10-25 DIAGNOSIS — G8929 Other chronic pain: Secondary | ICD-10-CM | POA: Diagnosis not present

## 2019-10-25 DIAGNOSIS — Z113 Encounter for screening for infections with a predominantly sexual mode of transmission: Secondary | ICD-10-CM | POA: Insufficient documentation

## 2019-10-25 DIAGNOSIS — E782 Mixed hyperlipidemia: Secondary | ICD-10-CM

## 2019-10-25 DIAGNOSIS — Z0000 Encounter for general adult medical examination without abnormal findings: Secondary | ICD-10-CM | POA: Diagnosis not present

## 2019-10-25 DIAGNOSIS — M25571 Pain in right ankle and joints of right foot: Secondary | ICD-10-CM

## 2019-10-25 DIAGNOSIS — M25572 Pain in left ankle and joints of left foot: Secondary | ICD-10-CM

## 2019-10-25 LAB — POCT URINALYSIS DIPSTICK
Glucose, UA: NEGATIVE
Leukocytes, UA: NEGATIVE
Protein, UA: NEGATIVE
Spec Grav, UA: >=1.03 — AB (ref 1.010–1.025)
Urobilinogen, UA: 0.2 E.U./dL
pH, UA: 6 (ref 5.0–8.0)

## 2019-10-25 LAB — COMPREHENSIVE METABOLIC PANEL
ALT: 17 U/L (ref 0–53)
AST: 14 U/L (ref 0–37)
Albumin: 4.4 g/dL (ref 3.5–5.2)
Alkaline Phosphatase: 77 U/L (ref 39–117)
BUN: 13 mg/dL (ref 6–23)
CO2: 30 mEq/L (ref 19–32)
Calcium: 9.6 mg/dL (ref 8.4–10.5)
Chloride: 104 mEq/L (ref 96–112)
Creatinine, Ser: 1.37 mg/dL (ref 0.40–1.50)
GFR: 70.37 mL/min (ref >60.00)
Glucose, Bld: 97 mg/dL (ref 70–99)
Potassium: 4.5 mEq/L (ref 3.5–5.1)
Sodium: 138 mEq/L (ref 135–145)
Total Bilirubin: 0.4 mg/dL (ref 0.2–1.2)
Total Protein: 6.8 g/dL (ref 6.0–8.3)

## 2019-10-25 LAB — URINALYSIS, MICROSCOPIC ONLY: RBC / HPF: NONE SEEN (ref 0–?)

## 2019-10-25 LAB — LIPID PANEL
Cholesterol: 249 mg/dL — ABNORMAL HIGH (ref 0–200)
HDL: 45.7 mg/dL (ref >39.00)
LDL Cholesterol: 177 mg/dL — ABNORMAL HIGH (ref 0–99)
NonHDL: 203.28
Total CHOL/HDL Ratio: 5
Triglycerides: 133 mg/dL (ref 0.0–149.0)
VLDL: 26.6 mg/dL (ref 0.0–40.0)

## 2019-10-25 LAB — CBC WITH DIFFERENTIAL/PLATELET
Basophils Absolute: 0 10*3/uL (ref 0.0–0.1)
Basophils Relative: 0.6 % (ref 0.0–3.0)
Eosinophils Absolute: 0.1 10*3/uL (ref 0.0–0.7)
Eosinophils Relative: 4 % (ref 0.0–5.0)
HCT: 46.7 % (ref 39.0–52.0)
Hemoglobin: 15.5 g/dL (ref 13.0–17.0)
Lymphocytes Relative: 48.9 % — ABNORMAL HIGH (ref 12.0–46.0)
Lymphs Abs: 1.7 10*3/uL (ref 0.7–4.0)
MCHC: 33.2 g/dL (ref 30.0–36.0)
MCV: 80.6 fl (ref 78.0–100.0)
Monocytes Absolute: 0.4 10*3/uL (ref 0.1–1.0)
Monocytes Relative: 11.8 % (ref 3.0–12.0)
Neutro Abs: 1.2 10*3/uL — ABNORMAL LOW (ref 1.4–7.7)
Neutrophils Relative %: 34.7 % — ABNORMAL LOW (ref 43.0–77.0)
Platelets: 237 10*3/uL (ref 150.0–400.0)
RBC: 5.79 Mil/uL (ref 4.22–5.81)
RDW: 14.5 % (ref 11.5–15.5)
WBC: 3.5 10*3/uL — ABNORMAL LOW (ref 4.0–10.5)

## 2019-10-25 LAB — HEMOGLOBIN A1C: Hgb A1c MFr Bld: 5.9 % (ref 4.6–6.5)

## 2019-10-25 LAB — T4, FREE: Free T4: 0.59 ng/dL — ABNORMAL LOW (ref 0.60–1.60)

## 2019-10-25 LAB — TSH: TSH: 0.96 u[IU]/mL (ref 0.35–4.50)

## 2019-10-25 NOTE — Progress Notes (Signed)
Subjective:     Michael Ramirez is a 40 y.o. male and is here for a comprehensive physical exam. The patient reports problems - urine stream and ankle pain.  Pt notes intermittent stopping of urine stream and odor x 1 yr.  Denies difficulty starting stream, dysuria, suprapubic pain, n/v, back pain, pain that moves, penile d/c.  Pt drinking 1 bottle of water per day.   Pt also with R ankle pain with prolonged standing.  States went to instride podiarty.  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  Notes the L ankle does not really bother him.  Wearing orthotics in work shoes.  Started a new job with [employer] in Dec.  Mentions hematuria was noted on pre employment physical.  Also seen by eye dr., wears contacts, advised of increased occular pressure.  Being monitored for glaucoma.  Social History   Socioeconomic History  . Marital status: Single    Spouse name: Not on file  . Number of children: Not on file  . Years of education: Not on file  . Highest education level: Not on file  Occupational History  . Occupation: Arts development officer: [employer]  Tobacco Use  . Smoking status: Former Smoker    Quit date: 11/25/2015    Years since quitting: 3.9  . Smokeless tobacco: Former Network engineer and Sexual Activity  . Alcohol use: Yes    Alcohol/week: 6.0 standard drinks    Types: 6 Shots of liquor per week    Comment: [place]  every other weekend   . Drug use: Yes    Types: Marijuana  . Sexual activity: Yes    Partners: Female    Birth control/protection: Condom  Other Topics Concern  . Not on file  Social History Narrative  . Not on file   Social Determinants of Health   Financial Resource Strain:   . Difficulty of Paying Living Expenses:   Food Insecurity:   . Worried About Charity fundraiser in the Last Year:   . Arboriculturist in the Last Year:   Transportation Needs:   . Film/video editor (Medical):   Marland Kitchen Lack of  Transportation (Non-Medical):   Physical Activity:   . Days of Exercise per Week:   . Minutes of Exercise per Session:   Stress:   . Feeling of Stress :   Social Connections:   . Frequency of Communication with Friends and Family:   . Frequency of Social Gatherings with Friends and Family:   . Attends Religious Services:   . Active Member of Clubs or Organizations:   . Attends Archivist Meetings:   Marland Kitchen Marital Status:   Intimate Partner Violence:   . Fear of Current or Ex-Partner:   . Emotionally Abused:   Marland Kitchen Physically Abused:   . Sexually Abused:    Health Maintenance  Topic Date Due  . TETANUS/TDAP  Never done  . INFLUENZA VACCINE  10/25/2019 (Originally 02/25/2019)  . HIV Screening  Completed    The following portions of the patient's history were reviewed and updated as appropriate: allergies, current medications, past family history, past medical history, past social history, past surgical history and problem list.  Review of Systems Pertinent items noted in HPI and remainder of comprehensive ROS otherwise negative.   Objective:    BP 110/80 (BP Location: Right Arm, Patient Position: Sitting, Cuff Size: Normal)   Pulse 66   Temp (!) 96.4  F (35.8 C) (Temporal)   Wt 173 lb 6.4 oz (78.7 kg)   SpO2 98%   BMI 26.56 kg/m  General appearance: alert, cooperative and no distress Head: Normocephalic, without obvious abnormality, atraumatic Eyes: conjunctivae/corneas clear. PERRL, EOM's intact. Fundi benign. Ears: normal TM's and external ear canals both ears Nose: Nares normal. Septum midline. Mucosa normal. No drainage or sinus tenderness. Throat: lips, mucosa, and tongue normal; teeth and gums normal Neck: no adenopathy, no carotid bruit, no JVD, supple, symmetrical, trachea midline and thyroid not enlarged, symmetric, no tenderness/mass/nodules Lungs: clear to auscultation bilaterally Heart: regular rate and rhythm, S1, S2 normal, no murmur, click, rub or  gallop Abdomen: soft, non-tender; bowel sounds normal; no masses,  no organomegaly Extremities: extremities normal, atraumatic, no cyanosis or edema Pulses: 2+ and symmetric Skin: Skin color, texture, turgor normal. No rashes or lesions Lymph nodes: Cervical, supraclavicular, and axillary nodes normal. Neurologic: Alert and oriented X 3, normal strength and tone. Normal symmetric reflexes. Normal coordination and gait    Assessment:    Healthy male exam with urine stream      Plan:     Anticipatory guidance given including wearing seatbelts, smoke detectors in the home, increasing physical activity, increasing p.o. intake of water and vegetables. -will obtain labs and STI screening -immunizations up to date -given handout -Next CPE in 1 yr See After Visit Summary for Counseling Recommendations    Chronic pain of both ankles  -continue f/u with podiatry -continue wearing orthotics - Plan: CBC with Differential/Platelet  Abnormal urinary stream  -discussed possible causes including infection, renal calculi -hydration encouraged - Plan: POCT urinalysis dipstick, Urine cytology ancillary only  Routine screening for STI (sexually transmitted infection)  - Plan: HIV antibody (with reflex), RPR, Urine cytology ancillary only  Mixed hyperlipidemia  -continue lifestyle modificaitons - Plan: Lipid panel  F/u prn  Abbe Amsterdam, MD

## 2019-10-25 NOTE — Addendum Note (Signed)
Addended by: Philemon Kingdom on: 10/25/2019 10:43 AM   Modules accepted: Orders

## 2019-10-25 NOTE — Patient Instructions (Signed)
Preventive Care 19-38 Years Old, Male Preventive care refers to lifestyle choices and visits with your health care provider that can promote health and wellness. This includes:  A yearly physical exam. This is also called an annual well check.  Regular dental and eye exams.  Immunizations.  Screening for certain conditions.  Healthy lifestyle choices, such as eating a healthy diet, getting regular exercise, not using drugs or products that contain nicotine and tobacco, and limiting alcohol use. What can I expect for my preventive care visit? Physical exam Your health care provider will check:  Height and weight. These may be used to calculate body mass index (BMI), which is a measurement that tells if you are at a healthy weight.  Heart rate and blood pressure.  Your skin for abnormal spots. Counseling Your health care provider may ask you questions about:  Alcohol, tobacco, and drug use.  Emotional well-being.  Home and relationship well-being.  Sexual activity.  Eating habits.  Work and work Statistician. What immunizations do I need?  Influenza (flu) vaccine  This is recommended every year. Tetanus, diphtheria, and pertussis (Tdap) vaccine  You may need a Td booster every 10 years. Varicella (chickenpox) vaccine  You may need this vaccine if you have not already been vaccinated. Human papillomavirus (HPV) vaccine  If recommended by your health care provider, you may need three doses over 6 months. Measles, mumps, and rubella (MMR) vaccine  You may need at least one dose of MMR. You may also need a second dose. Meningococcal conjugate (MenACWY) vaccine  One dose is recommended if you are 45-76 years old and a Market researcher living in a residence hall, or if you have one of several medical conditions. You may also need additional booster doses. Pneumococcal conjugate (PCV13) vaccine  You may need this if you have certain conditions and were not  previously vaccinated. Pneumococcal polysaccharide (PPSV23) vaccine  You may need one or two doses if you smoke cigarettes or if you have certain conditions. Hepatitis A vaccine  You may need this if you have certain conditions or if you travel or work in places where you may be exposed to hepatitis A. Hepatitis B vaccine  You may need this if you have certain conditions or if you travel or work in places where you may be exposed to hepatitis B. Haemophilus influenzae type b (Hib) vaccine  You may need this if you have certain risk factors. You may receive vaccines as individual doses or as more than one vaccine together in one shot (combination vaccines). Talk with your health care provider about the risks and benefits of combination vaccines. What tests do I need? Blood tests  Lipid and cholesterol levels. These may be checked every 5 years starting at age 17.  Hepatitis C test.  Hepatitis B test. Screening   Diabetes screening. This is done by checking your blood sugar (glucose) after you have not eaten for a while (fasting).  Sexually transmitted disease (STD) testing. Talk with your health care provider about your test results, treatment options, and if necessary, the need for more tests. Follow these instructions at home: Eating and drinking   Eat a diet that includes fresh fruits and vegetables, whole grains, lean protein, and low-fat dairy products.  Take vitamin and mineral supplements as recommended by your health care provider.  Do not drink alcohol if your health care provider tells you not to drink.  If you drink alcohol: ? Limit how much you have to 0-2  drinks a day. ? Be aware of how much alcohol is in your drink. In the [place] one drink equals one 12 oz bottle of beer (355 mL), one 5 oz glass of wine (148 mL), or one 1 oz glass of hard liquor (44 mL). Lifestyle  Take daily care of your teeth and gums.  Stay active. Exercise for at least 30 minutes on 5 or  more days each week.  Do not use any products that contain nicotine or tobacco, such as cigarettes, e-cigarettes, and chewing tobacco. If you need help quitting, ask your health care provider.  If you are sexually active, practice safe sex. Use a condom or other form of protection to prevent STIs (sexually transmitted infections). What's next?  Go to your health care provider once a year for a well check visit.  Ask your health care provider how often you should have your eyes and teeth checked.  Stay up to date on all vaccines. This information is not intended to replace advice given to you by your health care provider. Make sure you discuss any questions you have with your health care provider. Document Revised: 07/07/2018 Document Reviewed: 07/07/2018 Elsevier Patient Education  [place].  Dyslipidemia Dyslipidemia is an imbalance of waxy, fat-like substances (lipids) in the blood. The body needs lipids in small amounts. Dyslipidemia often involves a high level of cholesterol or triglycerides, which are types of lipids. Common forms of dyslipidemia include:  High levels of LDL cholesterol. LDL is the type of cholesterol that causes fatty deposits (plaques) to build up in the blood vessels that carry blood away from your heart (arteries).  Low levels of HDL cholesterol. HDL cholesterol is the type of cholesterol that protects against heart disease. High levels of HDL remove the LDL buildup from arteries.  High levels of triglycerides. Triglycerides are a fatty substance in the blood that is linked to a buildup of plaques in the arteries. What are the causes? Primary dyslipidemia is caused by changes (mutations) in genes that are passed down through families (inherited). These mutations cause several types of dyslipidemia. Secondary dyslipidemia is caused by lifestyle choices and diseases that lead to dyslipidemia, such as:  Eating a diet that is high in animal fat.  Not  getting enough exercise.  Having diabetes, kidney disease, liver disease, or thyroid disease.  Drinking large amounts of alcohol.  Using certain medicines. What increases the risk? You are more likely to develop this condition if you are an older man or if you are a woman who has gone through menopause. Other risk factors include:  Having a family history of dyslipidemia.  Taking certain medicines, including birth control pills, steroids, some diuretics, and beta-blockers.  Smoking cigarettes.  Eating a high-fat diet.  Having certain medical conditions such as diabetes, polycystic ovary syndrome (PCOS), kidney disease, liver disease, or hypothyroidism.  Not exercising regularly.  Being overweight or obese with too much belly fat. What are the signs or symptoms? In most cases, dyslipidemia does not usually cause any symptoms. In severe cases, very high lipid levels can cause:  Fatty bumps under the skin (xanthomas).  White or gray ring around the black center (pupil) of the eye. Very high triglyceride levels can cause inflammation of the pancreas (pancreatitis). How is this diagnosed? Your health care provider may diagnose dyslipidemia based on a routine blood test (fasting blood test). Because most people do not have symptoms of the condition, this blood testing (lipid profile) is done on adults age 58  and older and is repeated every 5 years. This test checks:  Total cholesterol. This measures the total amount of cholesterol in your blood, including LDL cholesterol, HDL cholesterol, and triglycerides. A healthy number is below 200.  LDL cholesterol. The target number for LDL cholesterol is different for each person, depending on individual risk factors. Ask your health care provider what your LDL cholesterol should be.  HDL cholesterol. An HDL level of 60 or higher is best because it helps to protect against heart disease. A number below 22 for men or below 16 for women  increases the risk for heart disease.  Triglycerides. A healthy triglyceride number is below 150. If your lipid profile is abnormal, your health care provider may do other blood tests. How is this treated? Treatment depends on the type of dyslipidemia that you have and your other risk factors for heart disease and stroke. Your health care provider will have a target range for your lipid levels based on this information. For many people, this condition may be treated by lifestyle changes, such as diet and exercise. Your health care provider may recommend that you:  Get regular exercise.  Make changes to your diet.  Quit smoking if you smoke. If diet changes and exercise do not help you reach your goals, your health care provider may also prescribe medicine to lower lipids. The most commonly prescribed type of medicine lowers your LDL cholesterol (statin drug). If you have a high triglyceride level, your provider may prescribe another type of drug (fibrate) or an omega-3 fish oil supplement, or both. Follow these instructions at home:  Eating and drinking  Follow instructions from your health care provider or dietitian about eating or drinking restrictions.  Eat a healthy diet as told by your health care provider. This can help you reach and maintain a healthy weight, lower your LDL cholesterol, and raise your HDL cholesterol. This may include: ? Limiting your calories, if you are overweight. ? Eating more fruits, vegetables, whole grains, fish, and lean meats. ? Limiting saturated fat, trans fat, and cholesterol.  If you drink alcohol: ? Limit how much you use. ? Be aware of how much alcohol is in your drink. In the [place] one drink equals one 12 oz bottle of beer (355 mL), one 5 oz glass of wine (148 mL), or one 1 oz glass of hard liquor (44 mL).  Do not drink alcohol if: ? Your health care provider tells you not to drink. ? You are pregnant, may be pregnant, or are planning to become  pregnant. Activity  Get regular exercise. Start an exercise and strength training program as told by your health care provider. Ask your health care provider what activities are safe for you. Your health care provider may recommend: ? 30 minutes of aerobic activity 4-6 days a week. Brisk walking is an example of aerobic activity. ? Strength training 2 days a week. General instructions  Do not use any products that contain nicotine or tobacco, such as cigarettes, e-cigarettes, and chewing tobacco. If you need help quitting, ask your health care provider.  Take over-the-counter and prescription medicines only as told by your health care provider. This includes supplements.  Keep all follow-up visits as told by your health care provider. Contact a health care provider if:  You are: ? Having trouble sticking to your exercise or diet plan. ? Struggling to quit smoking or control your use of alcohol. Summary  Dyslipidemia often involves a high level of cholesterol or  triglycerides, which are types of lipids.  Treatment depends on the type of dyslipidemia that you have and your other risk factors for heart disease and stroke.  For many people, treatment starts with lifestyle changes, such as diet and exercise.  Your health care provider may prescribe medicine to lower lipids. This information is not intended to replace advice given to you by your health care provider. Make sure you discuss any questions you have with your health care provider. Document Revised: 03/07/2018 Document Reviewed: 02/11/2018 Elsevier Patient Education  [place].  Hematuria, Adult Hematuria is blood in the urine. Blood may be visible in the urine, or it may be identified with a test. This condition can be caused by infections of the bladder, urethra, kidney, or prostate. Other possible causes include:  Kidney stones.  Cancer of the urinary tract.  Too much calcium in the urine.  Conditions that are  passed from parent to child (inherited conditions).  Exercise that requires a lot of energy. Infections can usually be treated with medicine, and a kidney stone usually will pass through your urine. If neither of these is the cause of your hematuria, more tests may be needed to identify the cause of your symptoms. It is very important to tell your health care provider about any blood in your urine, even if it is painless or the blood stops without treatment. Blood in the urine, when it happens and then stops and then happens again, can be a symptom of a very serious condition, including cancer. There is no pain in the initial stages of many urinary cancers. Follow these instructions at home: Medicines  Take over-the-counter and prescription medicines only as told by your health care provider.  If you were prescribed an antibiotic medicine, take it as told by your health care provider. Do not stop taking the antibiotic even if you start to feel better. Eating and drinking  Drink enough fluid to keep your urine clear or pale yellow. It is recommended that you drink 3-4 quarts (2.8-3.8 L) a day. If you have been diagnosed with an infection, it is recommended that you drink cranberry juice in addition to large amounts of water.  Avoid caffeine, tea, and carbonated beverages. These tend to irritate the bladder.  Avoid alcohol because it may irritate the prostate (men). General instructions  If you have been diagnosed with a kidney stone, follow your health care provider's instructions about straining your urine to catch the stone.  Empty your bladder often. Avoid holding urine for long periods of time.  If you are male: ? After a bowel movement, wipe from front to back and use each piece of toilet paper only once. ? Empty your bladder before and after sex.  Pay attention to any changes in your symptoms. Tell your health care provider about any changes or any new symptoms.  It is your  responsibility to get your test results. Ask your health care provider, or the department performing the test, when your results will be ready.  Keep all follow-up visits as told by your health care provider. This is important. Contact a health care provider if:  You develop back pain.  You have a fever.  You have nausea or vomiting.  Your symptoms do not improve after 3 days.  Your symptoms get worse. Get help right away if:  You develop severe vomiting and are unable take medicine without vomiting.  You develop severe pain in your back or abdomen even though you are  taking medicine.  You pass a large amount of blood in your urine.  You pass blood clots in your urine.  You feel very weak or like you might faint.  You faint. Summary  Hematuria is blood in the urine. It has many possible causes.  It is very important that you tell your health care provider about any blood in your urine, even if it is painless or the blood stops without treatment.  Take over-the-counter and prescription medicines only as told by your health care provider.  Drink enough fluid to keep your urine clear or pale yellow. This information is not intended to replace advice given to you by your health care provider. Make sure you discuss any questions you have with your health care provider. Document Revised: 12/07/2018 Document Reviewed: 08/15/2016 Elsevier Patient Education  2020 [employer].

## 2019-10-25 NOTE — Addendum Note (Signed)
Addended by: Abbe Amsterdam R on: 10/25/2019 11:08 AM   Modules accepted: Orders

## 2019-10-26 LAB — HIV ANTIBODY (ROUTINE TESTING W REFLEX): HIV 1&2 Ab, 4th Generation: NONREACTIVE

## 2019-10-26 LAB — RPR: RPR Ser Ql: NONREACTIVE

## 2019-10-30 LAB — URINE CYTOLOGY ANCILLARY ONLY
Bacterial Vaginitis-Urine: NEGATIVE
Chlamydia: NEGATIVE
Comment: NEGATIVE
Comment: NEGATIVE
Comment: NORMAL
Neisseria Gonorrhea: NEGATIVE
Trichomonas: NEGATIVE

## 2019-12-19 ENCOUNTER — Ambulatory Visit (INDEPENDENT_AMBULATORY_CARE_PROVIDER_SITE_OTHER): Payer: BC Managed Care – PPO | Admitting: Podiatry

## 2019-12-19 ENCOUNTER — Ambulatory Visit (INDEPENDENT_AMBULATORY_CARE_PROVIDER_SITE_OTHER): Payer: BC Managed Care – PPO

## 2019-12-19 ENCOUNTER — Other Ambulatory Visit: Payer: Self-pay

## 2019-12-19 DIAGNOSIS — M2142 Flat foot [pes planus] (acquired), left foot: Secondary | ICD-10-CM | POA: Diagnosis not present

## 2019-12-19 DIAGNOSIS — M2141 Flat foot [pes planus] (acquired), right foot: Secondary | ICD-10-CM | POA: Diagnosis not present

## 2019-12-19 DIAGNOSIS — M779 Enthesopathy, unspecified: Secondary | ICD-10-CM

## 2019-12-19 DIAGNOSIS — M19071 Primary osteoarthritis, right ankle and foot: Secondary | ICD-10-CM | POA: Diagnosis not present

## 2019-12-19 MED ORDER — MELOXICAM 15 MG PO TABS: 15.0000 mg | ORAL_TABLET | Freq: Every day | ORAL | 0 refills | Status: DC

## 2019-12-19 NOTE — Patient Instructions (Signed)
I have ordered an MRI of the right ankle. If you do not hear for them about scheduling within the next 1 week, or you have any questions please give us a call at [phone].   

## 2019-12-19 NOTE — Progress Notes (Addendum)
Subjective:   Patient ID: Michael Ramirez, male   DOB: 39 y.o.   MRN: [id number]   HPI 39 year old male presents the office today for concerns of right ankle pain.  This been ongoing for several years but seems to worsen the last couple of months.  He also gets similar symptoms of the left side but not as symptomatic.  He previously seen another podiatrist and he has steroid injection which helped for about 24 hours and was placed into a cam boot.  He states this was not helpful he was discussed surgical intervention.  He also tried inserts which were not helpful.  He denies any recent injury or trauma.  No significant swelling.  No other concerns today.   Review of Systems  All other systems reviewed and are negative.  Past Medical History:  Diagnosis Date  . Cluster headache     No past surgical history on file.   Current Outpatient Medications:  .  meloxicam (MOBIC) 15 MG tablet, Take 1 tablet (15 mg total) by mouth daily., Disp: 30 tablet, Rfl: 0  No Known Allergies        Objective:  Physical Exam  General: AAO x3, NAD  Dermatological: Skin is warm, dry and supple bilateral. Nails x 10 are well manicured; remaining integument appears unremarkable at this time. There are no open sores, no preulcerative lesions, no rash or signs of infection present.  Vascular: Dorsalis Pedis artery and Posterior Tibial artery pedal pulses are 2/4 bilateral with immedate capillary fill time. Pedal hair growth present. No varicosities and no lower extremity edema present bilateral. There is no pain with calf compression, swelling, warmth, erythema.   Neruologic: Grossly intact via light touch bilateral. . Protective threshold with Semmes Wienstein monofilament intact to all pedal sites bilateral. Negative tinel sign.   Musculoskeletal: Flatfoot is present.  There is tenderness palpation of the lateral aspect of foot on the sinus tarsi.  There is no pain with subtalar, ankle joint range of  motion.  Flexor, extensor tendons appear to be intact.  There is no other areas of discomfort identified at this time.  Muscular strength 5/5 in all groups tested bilateral.  Gait: Unassisted, Nonantalgic.      Assessment:   Subtalar joint capsulitis, flatfoot     Plan:  -Treatment options discussed including all alternatives, risks, and complications -Etiology of symptoms were discussed -X-rays were obtained and reviewed with the patient.  Mild narrowing of the subtalar joint present lateral view.  There is no evidence of acute fracture. -We discussed with conservative as well as surgical options.  I recommended MRI of the right ankle to further evaluate the subtalar joint and this is for surgical planning.  Discussed the possible subtalar joint arthrodesis versus reconstruction if needed.  In the meantime dispensed a Tri-Lock ankle brace and prescribed the Loxitane to take as needed.  Return for ankle pain after MRI.  Vivi Barrack DPM

## 2019-12-20 ENCOUNTER — Other Ambulatory Visit: Payer: Self-pay | Admitting: Podiatry

## 2019-12-20 DIAGNOSIS — M779 Enthesopathy, unspecified: Secondary | ICD-10-CM

## 2019-12-21 ENCOUNTER — Telehealth (INDEPENDENT_AMBULATORY_CARE_PROVIDER_SITE_OTHER): Payer: BC Managed Care – PPO | Admitting: *Deleted

## 2019-12-21 DIAGNOSIS — M2142 Flat foot [pes planus] (acquired), left foot: Secondary | ICD-10-CM

## 2019-12-21 DIAGNOSIS — M2141 Flat foot [pes planus] (acquired), right foot: Secondary | ICD-10-CM

## 2019-12-21 DIAGNOSIS — M779 Enthesopathy, unspecified: Secondary | ICD-10-CM

## 2019-12-21 DIAGNOSIS — M19071 Primary osteoarthritis, right ankle and foot: Secondary | ICD-10-CM

## 2019-12-21 NOTE — Telephone Encounter (Signed)
-----   Message from Vivi Barrack, DPM sent at 12/19/2019 10:16 AM EDT ----- Can you please order an MRI of the right ankle to evaluate STJ arthritis/pain and this is for surgical planning. Thank.s

## 2019-12-21 NOTE — Addendum Note (Signed)
Addended by: Alphia Kava D on: 12/21/2019 07:50 AM   Modules accepted: Level of Service

## 2019-12-21 NOTE — Telephone Encounter (Signed)
Orders to L. Cox, CMA for pre-cert and faxed to [facility]. 

## 2019-12-22 ENCOUNTER — Telehealth: Payer: Self-pay | Admitting: *Deleted

## 2019-12-22 NOTE — Telephone Encounter (Signed)
Called and spoke with Michael Ramirez from [facility] ([facility]) and the procedure code 26378 was not approved and would need to get a peer to peer and to call the [phone]. Misty Stanley

## 2020-01-04 ENCOUNTER — Telehealth: Payer: Self-pay | Admitting: Podiatry

## 2020-01-04 NOTE — Telephone Encounter (Signed)
That is fine. I see his MRI is not scheduled for a few more weeks. Thanks.

## 2020-01-04 NOTE — Telephone Encounter (Signed)
Mr. Michael Ramirez works 12 hours a day and Is having a hard time walking by the 3rd day. He is interested in intermittent FMLA, while deciding on surgery, Please advise?

## 2020-01-10 ENCOUNTER — Telehealth: Payer: Self-pay | Admitting: *Deleted

## 2020-01-10 NOTE — Telephone Encounter (Signed)
Called and spoke with Michael Ramirez from [place] ([facility]) and the procedure code 96438 was approved and the authorization number is [id number]. Michael Ramirez

## 2020-01-14 ENCOUNTER — Other Ambulatory Visit: Payer: Self-pay | Admitting: Podiatry

## 2020-01-18 DIAGNOSIS — L304 Erythema intertrigo: Secondary | ICD-10-CM | POA: Diagnosis not present

## 2020-01-20 ENCOUNTER — Ambulatory Visit
Admission: RE | Admit: 2020-01-20 | Discharge: 2020-01-20 | Disposition: A | Discharge status: Home / Self Care | Payer: BC Managed Care – PPO | Source: Ambulatory Visit | Attending: Podiatry | Admitting: Podiatry

## 2020-01-20 ENCOUNTER — Other Ambulatory Visit: Payer: Self-pay

## 2020-01-20 DIAGNOSIS — S86311A Strain of muscle(s) and tendon(s) of peroneal muscle group at lower leg level, right leg, initial encounter: Secondary | ICD-10-CM | POA: Diagnosis not present

## 2020-01-30 ENCOUNTER — Telehealth: Payer: Self-pay | Admitting: Podiatry

## 2020-01-30 NOTE — Telephone Encounter (Signed)
Patient is not scheduled for surgery yet, but hes coming in for surgical consultation on 6/22. He would like to be put out of work until surgery, because his job does not offer light duty. I spoke with Val and she ok'ed to take him out of work until his next visit on 6/22, then he would discuss further with you.

## 2020-02-05 NOTE — Telephone Encounter (Signed)
OK 

## 2020-02-09 ENCOUNTER — Other Ambulatory Visit: Payer: Self-pay | Admitting: Podiatry

## 2020-02-15 ENCOUNTER — Other Ambulatory Visit: Payer: Self-pay

## 2020-02-15 ENCOUNTER — Ambulatory Visit (INDEPENDENT_AMBULATORY_CARE_PROVIDER_SITE_OTHER): Payer: BC Managed Care – PPO

## 2020-02-15 ENCOUNTER — Ambulatory Visit (INDEPENDENT_AMBULATORY_CARE_PROVIDER_SITE_OTHER): Payer: BC Managed Care – PPO | Admitting: Podiatry

## 2020-02-15 DIAGNOSIS — Q6689 Other  specified congenital deformities of feet: Secondary | ICD-10-CM

## 2020-02-15 DIAGNOSIS — M19071 Primary osteoarthritis, right ankle and foot: Secondary | ICD-10-CM | POA: Diagnosis not present

## 2020-02-15 NOTE — Patient Instructions (Signed)

## 2020-02-19 NOTE — Progress Notes (Signed)
Subjective: 39 year old male presents the office today for follow-up evaluation after having MRI as well as for right foot pain.  He states that he has not been able to go back to work because of discomfort to his foot.  He points to the lateral aspect of the foot where he has majority discomfort mostly to the sinus tarsi.  Hurts more after he has been on his feet and walking.  This is now been ongoing for several months and he is seeing another physician for this.  She previously had steroid injection as well as immobilization without any improvement.  He is also tried inserts.  Denies any systemic complaints such as fevers, chills, nausea, vomiting. No acute changes since last appointment, and no other complaints at this time.   Objective: AAO x3, NAD DP/PT pulses palpable bilaterally, CRT less than 3 seconds Majority tenderness is the lateral aspect of the foot on the sinus tarsi there is mild discomfort on the course of the peroneal tendons as well.  Flexor, extensor tendons appear to be intact.  MMT 5/5.  Decreasing to motion of the subtalar joint however no significant pain with range of motion. No open lesions or pre-ulcerative lesions.  No pain with calf compression, swelling, warmth, erythema  Assessment: Tarsal coalition, peroneal tendon tear  Plan: -All treatment options discussed with the patient including all alternatives, risks, complications.  -X-rays obtained reviewed.  Resolution evident without evidence of acute fracture. -We long discussion today in regards to the options of both conservative as well as surgical treatment options.  After discussion he elects to proceed with surgical intervention.  We discussed possible arthrodesis given the tarsal coalition but on MRI and there is not significant arthritic changes.  We will plan for tarsal coalition excision as well as peroneal tendon repair of the right side.  He understand that he may need to have a further surgery for  reconstruction of flatfoot and/or arthrodesis if needed. -The incision placement as well as the postoperative course was discussed with the patient. I discussed risks of the surgery which include, but not limited to, infection, bleeding, pain, swelling, need for further surgery, delayed or nonhealing, painful or ugly scar, numbness or sensation changes, over/under correction, recurrence, transfer lesions, further deformity, hardware failure, DVT/PE, loss of toe/foot. Patient understands these risks and wishes to proceed with surgery. The surgical consent was reviewed with the patient all 3 pages were signed. No promises or guarantees were given to the outcome of the procedure. All questions were answered to the best of my ability. Before the surgery the patient was encouraged to call the office if there is any further questions. The surgery will be performed at the [facility] on an outpatient basis. -Patient encouraged to call the office with any questions, concerns, change in symptoms.   Vivi Barrack DPM

## 2020-02-20 ENCOUNTER — Telehealth: Payer: Self-pay

## 2020-02-20 NOTE — Telephone Encounter (Signed)
DOS 02/28/2020  PERONEAL TENDON REPAIR RT - 27658 TARSAL COALITION EXCISION RT - 28116  [employer] EFFECTIVE DATE - 07/28/2019  PLAN DEDUCTIBLE - $1750.00 W/ $0.00 REMAINING OUT OF POCKET - $5500.00 W/ $3666.00 REMAINING COPAY $0.00 COINSURANCE - 20%  SPOKE TO MARITZA AT [employer]. NO PRECERT REQUIRED FOR CPT [id number] OR 42395. SHE STATED THEY DO NOT HAVE CALL REF#'S

## 2020-02-28 ENCOUNTER — Encounter: Payer: Self-pay | Admitting: Podiatry

## 2020-02-28 ENCOUNTER — Other Ambulatory Visit: Payer: Self-pay | Admitting: Podiatry

## 2020-02-28 DIAGNOSIS — M66879 Spontaneous rupture of other tendons, unspecified ankle and foot: Secondary | ICD-10-CM

## 2020-02-28 DIAGNOSIS — S86311A Strain of muscle(s) and tendon(s) of peroneal muscle group at lower leg level, right leg, initial encounter: Secondary | ICD-10-CM | POA: Diagnosis not present

## 2020-02-28 DIAGNOSIS — Q6689 Other  specified congenital deformities of feet: Secondary | ICD-10-CM | POA: Diagnosis not present

## 2020-02-28 DIAGNOSIS — M25571 Pain in right ankle and joints of right foot: Secondary | ICD-10-CM | POA: Diagnosis not present

## 2020-02-28 DIAGNOSIS — Z01818 Encounter for other preprocedural examination: Secondary | ICD-10-CM | POA: Diagnosis not present

## 2020-02-28 DIAGNOSIS — M898X7 Other specified disorders of bone, ankle and foot: Secondary | ICD-10-CM | POA: Diagnosis not present

## 2020-02-28 HISTORY — PX: OTHER SURGICAL HISTORY: SHX169

## 2020-02-28 MED ORDER — CEPHALEXIN 500 MG PO CAPS: 500.0000 mg | ORAL_CAPSULE | Freq: Three times a day (TID) | ORAL | 0 refills | Status: DC

## 2020-02-28 MED ORDER — PROMETHAZINE HCL 25 MG PO TABS
25.0000 mg | ORAL_TABLET | Freq: Three times a day (TID) | ORAL | 0 refills | Status: DC | PRN
Start: 2020-02-28 — End: 2020-07-05

## 2020-02-28 MED ORDER — OXYCODONE-ACETAMINOPHEN 5-325 MG PO TABS: 1.0000 | ORAL_TABLET | Freq: Four times a day (QID) | ORAL | 0 refills | Status: DC | PRN

## 2020-02-28 NOTE — Progress Notes (Signed)
Post-op medications sent to the pharmacy.  

## 2020-02-29 ENCOUNTER — Telehealth: Payer: Self-pay | Admitting: *Deleted

## 2020-02-29 NOTE — Telephone Encounter (Signed)
Called and spoke with the patient stating that I was calling to check on the patient after having surgery on Wednesday August 4th 2021 with Dr Ardelle Anton and the patient states that he was doing ok and that the numbness was starting to wear off and that he was taken the pain medicine as directed and there is not any fever or chills and not any nausea and is icing and elevating and only getting up 15 minutes on the hour and I stated to call if any concerns or questions. Misty Stanley

## 2020-03-04 ENCOUNTER — Ambulatory Visit (INDEPENDENT_AMBULATORY_CARE_PROVIDER_SITE_OTHER): Payer: BC Managed Care – PPO

## 2020-03-04 ENCOUNTER — Telehealth: Payer: Self-pay | Admitting: Podiatry

## 2020-03-04 ENCOUNTER — Ambulatory Visit (INDEPENDENT_AMBULATORY_CARE_PROVIDER_SITE_OTHER): Payer: BC Managed Care – PPO | Admitting: Podiatry

## 2020-03-04 ENCOUNTER — Other Ambulatory Visit: Payer: Self-pay | Admitting: Podiatry

## 2020-03-04 ENCOUNTER — Other Ambulatory Visit: Payer: Self-pay

## 2020-03-04 DIAGNOSIS — S96911S Strain of unspecified muscle and tendon at ankle and foot level, right foot, sequela: Secondary | ICD-10-CM

## 2020-03-04 DIAGNOSIS — Q6689 Other  specified congenital deformities of feet: Secondary | ICD-10-CM | POA: Diagnosis not present

## 2020-03-04 MED ORDER — HYDROCODONE-ACETAMINOPHEN 5-325 MG PO TABS: 1.0000 | ORAL_TABLET | Freq: Four times a day (QID) | ORAL | 0 refills | Status: DC | PRN

## 2020-03-04 NOTE — Telephone Encounter (Signed)
sent 

## 2020-03-04 NOTE — Telephone Encounter (Signed)
Pt called for a refill of pain medication

## 2020-03-04 NOTE — Telephone Encounter (Signed)
Sent!

## 2020-03-04 NOTE — Telephone Encounter (Signed)
Pt called and stated that he was waiting on vicodin to be called into pharmacy for pain pt stated that oxycodone that was originally given is causing pt to break out

## 2020-03-05 NOTE — Progress Notes (Signed)
Subjective: Michael Ramirez is a 39 y.o. 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  States his pain level is 8/10 presenting 18 medications the Paxil causing the breakout hives.  He has only pending anti-inflammatories as needed.  He has been nonweightbearing in the cam boot.  Denies any systemic complaints such as fevers, chills, nausea, vomiting. No calf pain, chest pain, shortness of breath.   Objective: General: No acute distress, AAOx3  DP/PT pulses palpable 2/4, CRT < 3 sec to all digits.  Protective sensation intact. Motor function intact.  Right foot/ankle: Incision is well coapted without any evidence of dehiscence with sutures, staples intact. There is no surrounding erythema, ascending cellulitis, fluctuance, crepitus, malodor, drainage/purulence. There is mild edema around the surgical site. There is mild pain along the surgical site.  No other areas of tenderness to bilateral lower extremities.  No other open lesions or pre-ulcerative lesions.  No pain with calf compression, swelling, warmth, erythema.   Assessment and Plan:  Status post right coalition resection, tendon repair, doing well with no complications   -Treatment options discussed including all alternatives, risks, and complications -Incision is healing well.  Antibiotic ointment and dressing applied.  Keep dressing clean, dry, intact -Remain in cam boot and nonweightbearing for now he can do some gentle range of motion exercises starting later this week as tolerated -Ice/elevation -Pain medication as needed. Rx Vicodin.  -Monitor for any clinical signs or symptoms of infection and DVT/PE and directed to call the office immediately should any occur or go to the ER. -Follow-up as scheduled for possible suture removal or sooner if any problems arise. In the meantime, encouraged to call the office with any questions, concerns, change  in symptoms.   Michael Ramirez, DPM

## 2020-03-07 ENCOUNTER — Telehealth: Payer: Self-pay | Admitting: *Deleted

## 2020-03-07 NOTE — Telephone Encounter (Signed)
I informed pt of [facility] - Michael Ramirez statement and he stated he would personal pay.

## 2020-03-07 NOTE — Telephone Encounter (Signed)
[place] - [employer] states pt's Hydrocodone requires PA, fax [phone].

## 2020-03-07 NOTE — Telephone Encounter (Signed)
I spoke [facility] - Deanna Artis states PA is required then stated pt's insurance states he can only receive 7 days of the medication in 90 days. I asked Deanna Artis if pt could personal pay and she stated yes, it would be $23.79 without a coupon.

## 2020-03-09 DIAGNOSIS — S86311A Strain of muscle(s) and tendon(s) of peroneal muscle group at lower leg level, right leg, initial encounter: Secondary | ICD-10-CM | POA: Diagnosis not present

## 2020-03-14 ENCOUNTER — Ambulatory Visit (INDEPENDENT_AMBULATORY_CARE_PROVIDER_SITE_OTHER): Payer: BC Managed Care – PPO | Admitting: Podiatry

## 2020-03-14 ENCOUNTER — Other Ambulatory Visit: Payer: Self-pay

## 2020-03-14 DIAGNOSIS — M779 Enthesopathy, unspecified: Secondary | ICD-10-CM

## 2020-03-14 DIAGNOSIS — S96911S Strain of unspecified muscle and tendon at ankle and foot level, right foot, sequela: Secondary | ICD-10-CM

## 2020-03-14 DIAGNOSIS — Q6689 Other  specified congenital deformities of feet: Secondary | ICD-10-CM

## 2020-03-18 NOTE — Progress Notes (Signed)
Subjective: Michael Ramirez is a 39 y.o. 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  He presents here for possible suture, staple removal.  He states that overall he is doing better and the pain is improved.  He is not currently taking any pain medication.  Still nonweightbearing with the joint to motion exercises.  No other concerns today. Denies any systemic complaints such as fevers, chills, nausea, vomiting. No calf pain, chest pain, shortness of breath.   Objective: General: No acute distress, AAOx3  DP/PT pulses palpable 2/4, CRT < 3 sec to all digits.  Protective sensation intact. Motor function intact.  Right foot/ankle: Incision is well coapted without any evidence of dehiscence with sutures, staples intact.  There is mild edema but there is no surrounding erythema, ascending cellulitis but there is no drainage or pus or any clinical signs of infection noted today. No other areas of tenderness to bilateral lower extremities.  No other open lesions or pre-ulcerative lesions.  No pain with calf compression, swelling, warmth, erythema.   Assessment and Plan:  Status post right coalition resection, tendon repair, doing well with no complications   -Treatment options discussed including all alternatives, risks, and complications -I removed the sutures along the peroneal tendons but left the staples intact and half the sutures along the coalition resection site.  Antibiotic ointment was applied followed by dry sterile dressing.  Keep the dressing clean, dry, intact. -Continue nonweightbearing in cam boot.  He can continue gentle range of motion exercises as well -Ice/elevation -Pain medication as needed- has not been needing this -Monitor for any clinical signs or symptoms of infection and DVT/PE and directed to call the office immediately should any occur or go to the ER. -Follow-up as scheduled for  possible suture removal or sooner if any problems arise. In the meantime, encouraged to call the office with any questions, concerns, change in symptoms.   Ovid Curd, DPM

## 2020-03-19 ENCOUNTER — Telehealth: Payer: Self-pay

## 2020-03-19 NOTE — Telephone Encounter (Signed)
Pt would like an order for a knee scooter.

## 2020-03-20 ENCOUNTER — Other Ambulatory Visit: Payer: Self-pay | Admitting: Podiatry

## 2020-03-20 DIAGNOSIS — Q6689 Other  specified congenital deformities of feet: Secondary | ICD-10-CM

## 2020-03-20 DIAGNOSIS — M2141 Flat foot [pes planus] (acquired), right foot: Secondary | ICD-10-CM

## 2020-03-20 NOTE — Telephone Encounter (Signed)
Order for patient knee scooter has been place.

## 2020-03-20 NOTE — Telephone Encounter (Signed)
I placed the order. Can someone please follow up on this? Thanks.

## 2020-03-27 ENCOUNTER — Telehealth: Payer: Self-pay | Admitting: Podiatry

## 2020-03-27 ENCOUNTER — Other Ambulatory Visit: Payer: Self-pay | Admitting: Podiatry

## 2020-03-27 ENCOUNTER — Telehealth (INDEPENDENT_AMBULATORY_CARE_PROVIDER_SITE_OTHER): Payer: BC Managed Care – PPO | Admitting: Podiatry

## 2020-03-27 MED ORDER — HYDROCODONE-ACETAMINOPHEN 5-325 MG PO TABS: 1.0000 | ORAL_TABLET | Freq: Four times a day (QID) | ORAL | 0 refills | Status: DC | PRN

## 2020-03-27 NOTE — Telephone Encounter (Signed)
I have sent the pain medication.   Keely- didn't I already sign the order for the knee scooter? Can someone please follow up on this? Thanks.

## 2020-03-27 NOTE — Telephone Encounter (Signed)
Patient called requesting refill on pain medication. Also needs Rx for knee scooter.

## 2020-03-28 ENCOUNTER — Ambulatory Visit (INDEPENDENT_AMBULATORY_CARE_PROVIDER_SITE_OTHER): Payer: BC Managed Care – PPO | Admitting: Podiatry

## 2020-03-28 ENCOUNTER — Other Ambulatory Visit: Payer: Self-pay

## 2020-03-28 DIAGNOSIS — Q6689 Other  specified congenital deformities of feet: Secondary | ICD-10-CM

## 2020-03-28 DIAGNOSIS — S96911S Strain of unspecified muscle and tendon at ankle and foot level, right foot, sequela: Secondary | ICD-10-CM

## 2020-03-28 NOTE — Telephone Encounter (Signed)
Encounter in error

## 2020-04-03 NOTE — Telephone Encounter (Signed)
The order was accepted on 03/22/20. I send them a message to see what the status is on the delivery. I patient contact information was pending. Im not sure if they attempted to reach out to the patient or not. Will give you an update as soon as they reply to me.

## 2020-04-03 NOTE — Progress Notes (Signed)
Subjective: Michael Ramirez is a 39 y.o. 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  Overall states he is doing better.  Gets occasional discomfort he feels that the muscles feels like he has dissipated since the surgery.  Presents here for suture, staple removal.  We attempted to get a knee scooter for him but he has not yet received this.  Otherwise has been doing well he has no new concerns today.  Denies any fevers, chills, nausea, vomiting.  No calf pain, chest pain, shortness of breath.   Objective: General: No acute distress, AAOx3  DP/PT pulses palpable 2/4, CRT < 3 sec to all digits.  Protective sensation intact. Motor function intact.  Right foot/ankle: Incision is well coapted without any evidence of dehiscence with sutures, staples intact.  There is no surrounding erythema, drainage or pus or any signs of infection.  Minimal tenderness palpation of the surgical sites.  Improved subtalar joint range of motion compared to prior to surgery.  There is no pain with subtalar, midtarsal range of motion. No other open lesions or pre-ulcerative lesions.  No pain with calf compression, swelling, warmth, erythema.   Assessment and Plan:  Status post right coalition resection, tendon repair, doing well with no complications   -Treatment options discussed including all alternatives, risks, and complications -Today I remove the remainder the sutures without any complications as well as the staples.  He can start to shower tomorrow discussed dry thoroughly and apply a similar bandage.  Remain in the cam boot for now nonweightbearing.  He can do active range of motion exercises without resistance. -We will likely transition to partial weightbearing start physical therapy next appointment  Return in about 2 weeks (around 04/11/2020).  Vivi Barrack DPM

## 2020-04-05 ENCOUNTER — Other Ambulatory Visit: Payer: Self-pay | Admitting: Podiatry

## 2020-04-05 ENCOUNTER — Telehealth: Payer: Self-pay

## 2020-04-05 ENCOUNTER — Telehealth: Payer: Self-pay | Admitting: Podiatry

## 2020-04-05 ENCOUNTER — Encounter: Payer: Self-pay | Admitting: Podiatry

## 2020-04-05 MED ORDER — HYDROCODONE-ACETAMINOPHEN 5-325 MG PO TABS
1.0000 | ORAL_TABLET | Freq: Four times a day (QID) | ORAL | 0 refills | Status: DC | PRN
Start: 2020-04-05 — End: 2020-04-29

## 2020-04-05 MED ORDER — CYCLOBENZAPRINE HCL 10 MG PO TABS: 10.0000 mg | ORAL_TABLET | Freq: Three times a day (TID) | ORAL | 0 refills | Status: DC | PRN

## 2020-04-05 NOTE — Telephone Encounter (Signed)
Prior auth for pain medication denied. Called patient and left VM to call back if he needed anything

## 2020-04-05 NOTE — Telephone Encounter (Signed)
Pt is experiencing  a lot of aching pain. Please advise

## 2020-04-05 NOTE — Progress Notes (Signed)
PA for Vicodin completed, awaiting response

## 2020-04-05 NOTE — Progress Notes (Signed)
I called patient. He is having severe achy sensation to the foot. It starts under the foot and goes to the hip. Denies any calf/leg swelling, redness, cramping. No chest pain/SOB. Based on symptoms doubt DVT. Discussed he can start to transition to [facility] in but always in the CAM boot. Continue to ice/elevate. Refilled vicodin and also sent flexeril but discussed not taking together. He doesn't want to take the pain medication if able. If no improvement over the weekend he is going to call Monday or if it worseness over the weekend encouraged to call back. Discussed s/s of DVT.

## 2020-04-08 NOTE — Telephone Encounter (Signed)
I called and spoke with him Friday night.

## 2020-04-11 ENCOUNTER — Telehealth: Payer: Self-pay | Admitting: Podiatry

## 2020-04-11 NOTE — Telephone Encounter (Signed)
Can you extend it to November 1st. I called and spoke to the patient.

## 2020-04-11 NOTE — Telephone Encounter (Signed)
Michael Ramirez is scheduled to return to work 04/30/2020, is that still on schedule. I spoke with patient this morning and he was under the impression that the date needed to pushed up. Please advise?

## 2020-04-15 ENCOUNTER — Other Ambulatory Visit: Payer: Self-pay

## 2020-04-15 ENCOUNTER — Ambulatory Visit (INDEPENDENT_AMBULATORY_CARE_PROVIDER_SITE_OTHER): Payer: BC Managed Care – PPO | Admitting: Podiatry

## 2020-04-15 DIAGNOSIS — M2142 Flat foot [pes planus] (acquired), left foot: Secondary | ICD-10-CM

## 2020-04-15 DIAGNOSIS — Q6689 Other  specified congenital deformities of feet: Secondary | ICD-10-CM

## 2020-04-15 DIAGNOSIS — M2141 Flat foot [pes planus] (acquired), right foot: Secondary | ICD-10-CM

## 2020-04-15 DIAGNOSIS — S96911S Strain of unspecified muscle and tendon at ankle and foot level, right foot, sequela: Secondary | ICD-10-CM

## 2020-04-17 DIAGNOSIS — M25671 Stiffness of right ankle, not elsewhere classified: Secondary | ICD-10-CM | POA: Diagnosis not present

## 2020-04-17 DIAGNOSIS — M6281 Muscle weakness (generalized): Secondary | ICD-10-CM | POA: Diagnosis not present

## 2020-04-17 DIAGNOSIS — M899 Disorder of bone, unspecified: Secondary | ICD-10-CM | POA: Diagnosis not present

## 2020-04-17 DIAGNOSIS — M25571 Pain in right ankle and joints of right foot: Secondary | ICD-10-CM | POA: Diagnosis not present

## 2020-04-19 DIAGNOSIS — M6281 Muscle weakness (generalized): Secondary | ICD-10-CM | POA: Diagnosis not present

## 2020-04-19 DIAGNOSIS — M25671 Stiffness of right ankle, not elsewhere classified: Secondary | ICD-10-CM | POA: Diagnosis not present

## 2020-04-19 DIAGNOSIS — M25571 Pain in right ankle and joints of right foot: Secondary | ICD-10-CM | POA: Diagnosis not present

## 2020-04-19 DIAGNOSIS — M899 Disorder of bone, unspecified: Secondary | ICD-10-CM | POA: Diagnosis not present

## 2020-04-19 NOTE — Progress Notes (Signed)
Subjective: Michael Ramirez is a 39 y.o. 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  Since I last saw him he started to be partial weightbearing in a cam boot.  He is ready for physical therapy and his strength back.  Denies recent injury or falls any other changes. Denies any fevers, chills, nausea, vomiting.  No calf pain, chest pain, shortness of breath.   Objective: General: No acute distress, AAOx3  DP/PT pulses palpable 2/4, CRT < 3 sec to all digits.  Protective sensation intact. Motor function intact.  Right foot/ankle: Incision is well coapted without any evidence of dehiscence and scars are formed.  There is 2 areas in the left foot on the navicular tuberosity and lateral ankle where the boot has right but there is no breakdown of the skin identified today.  The incision there is no drainage or pus or any surrounding erythema, ascending cellulitis.  Significant improvement in symptoms range of motion compared to preoperatively.  No significant pain today on palpation to the surgical sites. No other open lesions or pre-ulcerative lesions.  No pain with calf compression, swelling, warmth, erythema.   Assessment and Plan:  Status post right coalition resection, tendon repair, doing well with no complications   -Treatment options discussed including all alternatives, risks, and complications -Incisions are healing well.  Keep antibiotic ointment on the incisions for now.  Also referral for [facility] was placed.  He can transition to weight-bear as tolerated in the cam boot once he starts physical therapy.  As he progresses with physical therapy he can transition to weightbearing in the shoe.  Vivi Barrack DPM

## 2020-04-22 DIAGNOSIS — M899 Disorder of bone, unspecified: Secondary | ICD-10-CM | POA: Diagnosis not present

## 2020-04-22 DIAGNOSIS — M25671 Stiffness of right ankle, not elsewhere classified: Secondary | ICD-10-CM | POA: Diagnosis not present

## 2020-04-22 DIAGNOSIS — M6281 Muscle weakness (generalized): Secondary | ICD-10-CM | POA: Diagnosis not present

## 2020-04-22 DIAGNOSIS — M25571 Pain in right ankle and joints of right foot: Secondary | ICD-10-CM | POA: Diagnosis not present

## 2020-04-24 DIAGNOSIS — M6281 Muscle weakness (generalized): Secondary | ICD-10-CM | POA: Diagnosis not present

## 2020-04-24 DIAGNOSIS — M25571 Pain in right ankle and joints of right foot: Secondary | ICD-10-CM | POA: Diagnosis not present

## 2020-04-24 DIAGNOSIS — M899 Disorder of bone, unspecified: Secondary | ICD-10-CM | POA: Diagnosis not present

## 2020-04-24 DIAGNOSIS — M25671 Stiffness of right ankle, not elsewhere classified: Secondary | ICD-10-CM | POA: Diagnosis not present

## 2020-04-26 ENCOUNTER — Telehealth: Payer: Self-pay

## 2020-04-26 DIAGNOSIS — M6281 Muscle weakness (generalized): Secondary | ICD-10-CM | POA: Diagnosis not present

## 2020-04-26 DIAGNOSIS — M25671 Stiffness of right ankle, not elsewhere classified: Secondary | ICD-10-CM | POA: Diagnosis not present

## 2020-04-26 DIAGNOSIS — M899 Disorder of bone, unspecified: Secondary | ICD-10-CM | POA: Diagnosis not present

## 2020-04-26 DIAGNOSIS — M25571 Pain in right ankle and joints of right foot: Secondary | ICD-10-CM | POA: Diagnosis not present

## 2020-04-26 NOTE — Telephone Encounter (Signed)
Pt called today for a refill on his pain meds.

## 2020-04-29 ENCOUNTER — Other Ambulatory Visit: Payer: Self-pay | Admitting: Podiatry

## 2020-04-29 DIAGNOSIS — M25671 Stiffness of right ankle, not elsewhere classified: Secondary | ICD-10-CM | POA: Diagnosis not present

## 2020-04-29 DIAGNOSIS — M25571 Pain in right ankle and joints of right foot: Secondary | ICD-10-CM | POA: Diagnosis not present

## 2020-04-29 DIAGNOSIS — M899 Disorder of bone, unspecified: Secondary | ICD-10-CM | POA: Diagnosis not present

## 2020-04-29 DIAGNOSIS — M6281 Muscle weakness (generalized): Secondary | ICD-10-CM | POA: Diagnosis not present

## 2020-04-29 MED ORDER — HYDROCODONE-ACETAMINOPHEN 5-325 MG PO TABS
1.0000 | ORAL_TABLET | Freq: Four times a day (QID) | ORAL | 0 refills | Status: DC | PRN
Start: 2020-04-29 — End: 2020-05-10

## 2020-04-29 NOTE — Telephone Encounter (Signed)
sent 

## 2020-05-01 DIAGNOSIS — M25671 Stiffness of right ankle, not elsewhere classified: Secondary | ICD-10-CM | POA: Diagnosis not present

## 2020-05-01 DIAGNOSIS — M899 Disorder of bone, unspecified: Secondary | ICD-10-CM | POA: Diagnosis not present

## 2020-05-01 DIAGNOSIS — M6281 Muscle weakness (generalized): Secondary | ICD-10-CM | POA: Diagnosis not present

## 2020-05-01 DIAGNOSIS — M25571 Pain in right ankle and joints of right foot: Secondary | ICD-10-CM | POA: Diagnosis not present

## 2020-05-06 DIAGNOSIS — M25571 Pain in right ankle and joints of right foot: Secondary | ICD-10-CM | POA: Diagnosis not present

## 2020-05-06 DIAGNOSIS — M25671 Stiffness of right ankle, not elsewhere classified: Secondary | ICD-10-CM | POA: Diagnosis not present

## 2020-05-06 DIAGNOSIS — M6281 Muscle weakness (generalized): Secondary | ICD-10-CM | POA: Diagnosis not present

## 2020-05-06 DIAGNOSIS — M899 Disorder of bone, unspecified: Secondary | ICD-10-CM | POA: Diagnosis not present

## 2020-05-08 ENCOUNTER — Telehealth: Payer: Self-pay | Admitting: *Deleted

## 2020-05-08 DIAGNOSIS — M25571 Pain in right ankle and joints of right foot: Secondary | ICD-10-CM | POA: Diagnosis not present

## 2020-05-08 DIAGNOSIS — M6281 Muscle weakness (generalized): Secondary | ICD-10-CM | POA: Diagnosis not present

## 2020-05-08 DIAGNOSIS — M25671 Stiffness of right ankle, not elsewhere classified: Secondary | ICD-10-CM | POA: Diagnosis not present

## 2020-05-08 DIAGNOSIS — M899 Disorder of bone, unspecified: Secondary | ICD-10-CM | POA: Diagnosis not present

## 2020-05-08 NOTE — Telephone Encounter (Signed)
Patient is requesting a refill on Hydrocodone -Acetaminophen 5-325mg  tablet sent to his pharmacy on file.

## 2020-05-09 NOTE — Telephone Encounter (Signed)
Can you refill this prescription for patient please. Thanks.

## 2020-05-10 ENCOUNTER — Other Ambulatory Visit: Payer: Self-pay | Admitting: Podiatry

## 2020-05-10 DIAGNOSIS — M6281 Muscle weakness (generalized): Secondary | ICD-10-CM | POA: Diagnosis not present

## 2020-05-10 DIAGNOSIS — M899 Disorder of bone, unspecified: Secondary | ICD-10-CM | POA: Diagnosis not present

## 2020-05-10 DIAGNOSIS — M25671 Stiffness of right ankle, not elsewhere classified: Secondary | ICD-10-CM | POA: Diagnosis not present

## 2020-05-10 DIAGNOSIS — M25571 Pain in right ankle and joints of right foot: Secondary | ICD-10-CM | POA: Diagnosis not present

## 2020-05-10 MED ORDER — HYDROCODONE-ACETAMINOPHEN 5-325 MG PO TABS
1.0000 | ORAL_TABLET | Freq: Four times a day (QID) | ORAL | 0 refills | Status: DC | PRN
Start: 2020-05-10 — End: 2020-05-13

## 2020-05-10 NOTE — Progress Notes (Signed)
PRN pain 

## 2020-05-10 NOTE — Telephone Encounter (Signed)
Rx sent. - Dr. Gola Bribiesca

## 2020-05-13 ENCOUNTER — Telehealth: Payer: Self-pay | Admitting: Podiatry

## 2020-05-13 ENCOUNTER — Other Ambulatory Visit: Payer: Self-pay | Admitting: Podiatry

## 2020-05-13 DIAGNOSIS — M25671 Stiffness of right ankle, not elsewhere classified: Secondary | ICD-10-CM | POA: Diagnosis not present

## 2020-05-13 DIAGNOSIS — M899 Disorder of bone, unspecified: Secondary | ICD-10-CM | POA: Diagnosis not present

## 2020-05-13 DIAGNOSIS — M25571 Pain in right ankle and joints of right foot: Secondary | ICD-10-CM | POA: Diagnosis not present

## 2020-05-13 DIAGNOSIS — M6281 Muscle weakness (generalized): Secondary | ICD-10-CM | POA: Diagnosis not present

## 2020-05-13 MED ORDER — HYDROCODONE-ACETAMINOPHEN 5-325 MG PO TABS
1.0000 | ORAL_TABLET | Freq: Four times a day (QID) | ORAL | 0 refills | Status: DC | PRN
Start: 2020-05-13 — End: 2020-05-16

## 2020-05-13 NOTE — Telephone Encounter (Signed)
Pt called and stated that he did not receive his pain meds on Friday. Could you re-send since e-script was down

## 2020-05-15 DIAGNOSIS — M899 Disorder of bone, unspecified: Secondary | ICD-10-CM | POA: Diagnosis not present

## 2020-05-15 DIAGNOSIS — M25571 Pain in right ankle and joints of right foot: Secondary | ICD-10-CM | POA: Diagnosis not present

## 2020-05-15 DIAGNOSIS — M6281 Muscle weakness (generalized): Secondary | ICD-10-CM | POA: Diagnosis not present

## 2020-05-15 DIAGNOSIS — M25671 Stiffness of right ankle, not elsewhere classified: Secondary | ICD-10-CM | POA: Diagnosis not present

## 2020-05-16 ENCOUNTER — Other Ambulatory Visit: Payer: Self-pay | Admitting: Podiatry

## 2020-05-16 MED ORDER — HYDROCODONE-ACETAMINOPHEN 5-325 MG PO TABS
1.0000 | ORAL_TABLET | Freq: Four times a day (QID) | ORAL | 0 refills | Status: DC | PRN
Start: 2020-05-16 — End: 2020-05-27

## 2020-05-20 DIAGNOSIS — M25571 Pain in right ankle and joints of right foot: Secondary | ICD-10-CM | POA: Diagnosis not present

## 2020-05-20 DIAGNOSIS — M25671 Stiffness of right ankle, not elsewhere classified: Secondary | ICD-10-CM | POA: Diagnosis not present

## 2020-05-20 DIAGNOSIS — M6281 Muscle weakness (generalized): Secondary | ICD-10-CM | POA: Diagnosis not present

## 2020-05-20 DIAGNOSIS — M899 Disorder of bone, unspecified: Secondary | ICD-10-CM | POA: Diagnosis not present

## 2020-05-22 DIAGNOSIS — M25671 Stiffness of right ankle, not elsewhere classified: Secondary | ICD-10-CM | POA: Diagnosis not present

## 2020-05-22 DIAGNOSIS — M25571 Pain in right ankle and joints of right foot: Secondary | ICD-10-CM | POA: Diagnosis not present

## 2020-05-22 DIAGNOSIS — M899 Disorder of bone, unspecified: Secondary | ICD-10-CM | POA: Diagnosis not present

## 2020-05-22 DIAGNOSIS — M6281 Muscle weakness (generalized): Secondary | ICD-10-CM | POA: Diagnosis not present

## 2020-05-23 ENCOUNTER — Other Ambulatory Visit: Payer: Self-pay

## 2020-05-23 ENCOUNTER — Ambulatory Visit (INDEPENDENT_AMBULATORY_CARE_PROVIDER_SITE_OTHER): Payer: BC Managed Care – PPO | Admitting: Podiatry

## 2020-05-23 DIAGNOSIS — M2142 Flat foot [pes planus] (acquired), left foot: Secondary | ICD-10-CM

## 2020-05-23 DIAGNOSIS — S96911S Strain of unspecified muscle and tendon at ankle and foot level, right foot, sequela: Secondary | ICD-10-CM

## 2020-05-23 DIAGNOSIS — M2141 Flat foot [pes planus] (acquired), right foot: Secondary | ICD-10-CM

## 2020-05-23 DIAGNOSIS — Q6689 Other  specified congenital deformities of feet: Secondary | ICD-10-CM

## 2020-05-25 NOTE — Progress Notes (Signed)
Subjective: Michael Ramirez is a 39 y.o. 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  He states that since I last saw him he started physical therapy feel that he has been making some good improvements with this.  The stiffness in the morning when he first gets up gets better with activity he does feel that the physical therapy is been helpful.  He is scheduled go back to work however he is not sure if he can stand with amount of time he is supposed to.  He does not feel that he is quite ready to go back to work yet.  He denies any recent injury or falls and also no other concerns today. Denies any fevers, chills, nausea, vomiting.  No calf pain, chest pain, shortness of breath.   Objective: General: No acute distress, AAOx3 - presents today wearing a regular shoe DP/PT pulses palpable 2/4, CRT < 3 sec to all digits.  Protective sensation intact. Motor function intact.  Right foot/ankle: Incision is well coapted without any evidence of dehiscence and scars are formed.  There is no pain feeling sinus tarsi there is no pain with subtalar, ankle, midtarsal range of motion.  No significant discomfort on palpation to the peroneal tendon.  He states majority discomfort he gets after being on his feet.  No other open lesions or pre-ulcerative lesions.  No pain with calf compression, swelling, warmth, erythema.   Assessment and Plan:  Status post right coalition resection, tendon repair, doing well with no complications   -Treatment options discussed including all alternatives, risks, and complications -At this point she is making progress with physical therapy.  I will have him follow-up for new orthotics which she feels the old ones are worn out.  Also he is not quite ready to back to work to extend his out of work for 3 more weeks.  If he feels that he cannot work prior to that I will release him but he does not feel  he can do back to work and do his job at this point.  Vivi Barrack DPM

## 2020-05-27 ENCOUNTER — Other Ambulatory Visit: Payer: Self-pay | Admitting: Podiatry

## 2020-05-27 ENCOUNTER — Telehealth: Payer: Self-pay | Admitting: Podiatry

## 2020-05-27 DIAGNOSIS — M25671 Stiffness of right ankle, not elsewhere classified: Secondary | ICD-10-CM | POA: Diagnosis not present

## 2020-05-27 DIAGNOSIS — M899 Disorder of bone, unspecified: Secondary | ICD-10-CM | POA: Diagnosis not present

## 2020-05-27 DIAGNOSIS — M25571 Pain in right ankle and joints of right foot: Secondary | ICD-10-CM | POA: Diagnosis not present

## 2020-05-27 DIAGNOSIS — M6281 Muscle weakness (generalized): Secondary | ICD-10-CM | POA: Diagnosis not present

## 2020-05-27 MED ORDER — HYDROCODONE-ACETAMINOPHEN 5-325 MG PO TABS
1.0000 | ORAL_TABLET | Freq: Four times a day (QID) | ORAL | 0 refills | Status: DC | PRN
Start: 2020-05-27 — End: 2020-06-03

## 2020-05-27 NOTE — Telephone Encounter (Signed)
Pt would like a refill on HYDROcodone-acetaminophen (NORCO/VICODIN) 5-325 MG. He will be paying for Rx out of pocket. Please advise.

## 2020-05-27 NOTE — Telephone Encounter (Signed)
Sent please let him know. Thanks.

## 2020-05-29 DIAGNOSIS — M6281 Muscle weakness (generalized): Secondary | ICD-10-CM | POA: Diagnosis not present

## 2020-05-29 DIAGNOSIS — M25671 Stiffness of right ankle, not elsewhere classified: Secondary | ICD-10-CM | POA: Diagnosis not present

## 2020-05-29 DIAGNOSIS — M899 Disorder of bone, unspecified: Secondary | ICD-10-CM | POA: Diagnosis not present

## 2020-05-29 DIAGNOSIS — M25571 Pain in right ankle and joints of right foot: Secondary | ICD-10-CM | POA: Diagnosis not present

## 2020-06-03 ENCOUNTER — Other Ambulatory Visit: Payer: Self-pay

## 2020-06-03 ENCOUNTER — Other Ambulatory Visit: Payer: Self-pay | Admitting: Podiatry

## 2020-06-03 ENCOUNTER — Telehealth: Payer: Self-pay | Admitting: Podiatry

## 2020-06-03 ENCOUNTER — Ambulatory Visit (INDEPENDENT_AMBULATORY_CARE_PROVIDER_SITE_OTHER): Payer: BC Managed Care – PPO | Admitting: Orthotics

## 2020-06-03 DIAGNOSIS — M25571 Pain in right ankle and joints of right foot: Secondary | ICD-10-CM | POA: Diagnosis not present

## 2020-06-03 DIAGNOSIS — M899 Disorder of bone, unspecified: Secondary | ICD-10-CM | POA: Diagnosis not present

## 2020-06-03 DIAGNOSIS — M2141 Flat foot [pes planus] (acquired), right foot: Secondary | ICD-10-CM | POA: Diagnosis not present

## 2020-06-03 DIAGNOSIS — M2142 Flat foot [pes planus] (acquired), left foot: Secondary | ICD-10-CM | POA: Diagnosis not present

## 2020-06-03 DIAGNOSIS — S96911S Strain of unspecified muscle and tendon at ankle and foot level, right foot, sequela: Secondary | ICD-10-CM | POA: Diagnosis not present

## 2020-06-03 DIAGNOSIS — Q6689 Other  specified congenital deformities of feet: Secondary | ICD-10-CM

## 2020-06-03 DIAGNOSIS — M6281 Muscle weakness (generalized): Secondary | ICD-10-CM | POA: Diagnosis not present

## 2020-06-03 DIAGNOSIS — M25671 Stiffness of right ankle, not elsewhere classified: Secondary | ICD-10-CM | POA: Diagnosis not present

## 2020-06-03 MED ORDER — HYDROCODONE-ACETAMINOPHEN 5-325 MG PO TABS
1.0000 | ORAL_TABLET | Freq: Four times a day (QID) | ORAL | 0 refills | Status: DC | PRN
Start: 2020-06-03 — End: 2020-07-05

## 2020-06-03 NOTE — Telephone Encounter (Signed)
Pt would like a refill on the HYDROcodone. Please advise.

## 2020-06-03 NOTE — Progress Notes (Signed)
Patient cast today for cmfo to help bring post sx RF into better stabiltiy (peroneal tendon repair) Plan on semi rigid device with RF stabilty, valgus wedging and a 1/8" lift.

## 2020-06-03 NOTE — Telephone Encounter (Signed)
sent 

## 2020-06-10 ENCOUNTER — Telehealth: Payer: Self-pay | Admitting: Podiatry

## 2020-06-10 DIAGNOSIS — M899 Disorder of bone, unspecified: Secondary | ICD-10-CM | POA: Diagnosis not present

## 2020-06-10 DIAGNOSIS — M25571 Pain in right ankle and joints of right foot: Secondary | ICD-10-CM | POA: Diagnosis not present

## 2020-06-10 DIAGNOSIS — M25671 Stiffness of right ankle, not elsewhere classified: Secondary | ICD-10-CM | POA: Diagnosis not present

## 2020-06-10 DIAGNOSIS — M6281 Muscle weakness (generalized): Secondary | ICD-10-CM | POA: Diagnosis not present

## 2020-06-11 ENCOUNTER — Telehealth: Payer: Self-pay | Admitting: Podiatry

## 2020-06-11 NOTE — Telephone Encounter (Signed)
Dawn- can you please check the status of the orthotics. He is scheduled to go back to work next week but was hoping the get the orthotics before doing so.  Marylene Land- he is going to drop off paperwork Wednesday.   Thanks.

## 2020-06-11 NOTE — Telephone Encounter (Signed)
Patient is returning to work next week and would like to be clear on his restrictions. He also wants to know if he needs to continue to wear brace.

## 2020-06-12 NOTE — Telephone Encounter (Signed)
That would be OK to push it out

## 2020-06-14 ENCOUNTER — Telehealth: Payer: Self-pay | Admitting: Podiatry

## 2020-06-14 NOTE — Telephone Encounter (Signed)
Pt requested refill on hydrocodone-acetaminophen (NORCO/VICODIN) 5-325 mg. Please advise.

## 2020-06-19 ENCOUNTER — Other Ambulatory Visit: Payer: Self-pay | Admitting: Podiatry

## 2020-06-19 MED ORDER — TRAMADOL HCL 50 MG PO TABS: 50.0000 mg | ORAL_TABLET | Freq: Two times a day (BID) | ORAL | 0 refills | Status: AC | PRN

## 2020-06-19 NOTE — Progress Notes (Signed)
Tramadol sent Pt notified

## 2020-07-02 ENCOUNTER — Other Ambulatory Visit: Payer: Self-pay

## 2020-07-02 ENCOUNTER — Ambulatory Visit: Payer: BC Managed Care – PPO | Admitting: Orthotics

## 2020-07-02 DIAGNOSIS — S96911S Strain of unspecified muscle and tendon at ankle and foot level, right foot, sequela: Secondary | ICD-10-CM

## 2020-07-02 DIAGNOSIS — M2141 Flat foot [pes planus] (acquired), right foot: Secondary | ICD-10-CM

## 2020-07-02 DIAGNOSIS — M2142 Flat foot [pes planus] (acquired), left foot: Secondary | ICD-10-CM

## 2020-07-02 DIAGNOSIS — Q6689 Other  specified congenital deformities of feet: Secondary | ICD-10-CM

## 2020-07-02 NOTE — Progress Notes (Signed)
Patient picked up f/o and was pleased with fit, comfort, and function.  Worked well with footwear.  Told of rbeak in period and how to report any issues.  

## 2020-07-03 DIAGNOSIS — M25571 Pain in right ankle and joints of right foot: Secondary | ICD-10-CM | POA: Diagnosis not present

## 2020-07-03 DIAGNOSIS — M25671 Stiffness of right ankle, not elsewhere classified: Secondary | ICD-10-CM | POA: Diagnosis not present

## 2020-07-03 DIAGNOSIS — M899 Disorder of bone, unspecified: Secondary | ICD-10-CM | POA: Diagnosis not present

## 2020-07-03 DIAGNOSIS — M6281 Muscle weakness (generalized): Secondary | ICD-10-CM | POA: Diagnosis not present

## 2020-07-04 ENCOUNTER — Encounter: Payer: Self-pay | Admitting: Podiatry

## 2020-07-05 ENCOUNTER — Encounter: Payer: Self-pay | Admitting: Family Medicine

## 2020-07-05 ENCOUNTER — Telehealth (INDEPENDENT_AMBULATORY_CARE_PROVIDER_SITE_OTHER): Payer: BC Managed Care – PPO | Admitting: Family Medicine

## 2020-07-05 VITALS — Wt 170.0 lb

## 2020-07-05 DIAGNOSIS — G44011 Episodic cluster headache, intractable: Secondary | ICD-10-CM

## 2020-07-05 MED ORDER — SUMATRIPTAN SUCCINATE 50 MG PO TABS: ORAL_TABLET | ORAL | 0 refills | Status: DC

## 2020-07-05 NOTE — Progress Notes (Signed)
Virtual Visit via Video Note  I connected with Michael Ramirez on 07/05/20 at 10:00 AM EST by a video enabled telemedicine application 2/2 COVID-19 pandemic and verified that I am speaking with the correct person using two identifiers.  Location patient: home Location provider:work or home office Persons participating in the virtual visit: patient, provider  I discussed the limitations of evaluation and management by telemedicine and the availability of in person appointments. The patient expressed understanding and agreed to proceed.   HPI: Pt is a 39 yo male with pmh sig for h/o cluster headaches who was seen for acute concern.  Pt endorses ongoing HAs x 1 wk straight.  Tired benadryl, dayquil, ibuprofen, and Aleeve which only last 1 hr.  Pt denies facial pain or pressure.  Pain is in R temple and behind right eye.  Endorses sensitivity to sound.  Denies photophobia, nausea, vomiting pt's last cluster headache was in 2015 which required Imitrex and O2 therapy.  Pt was seen by neurology at that point, but was not started on prophylactic therapy.  Patient does not drink caffeine.  Drinks lots of water per day.  Goes to bed at 12 and gets up at 6:30 AM.  Eating better.  Endorses alcohol use on the weekends, 4 shots per night.  Of note pt had cold-like symptoms 1 week ago consisting of sore throat and nasal congestion.  Pt is R handed.   ROS: See pertinent positives and negatives per HPI.  Past Medical History:  Diagnosis Date  . Cluster headache     No past surgical history on file.  Family History  Problem Relation Age of Onset  . Hypertension Father   . Anxiety disorder Father   . Heart disease Paternal Grandmother   . Cancer Paternal Grandfather      Current Outpatient Medications:  .  TRAMADOL HCL PO, Take 50 mg by mouth as needed., Disp: , Rfl:   EXAM:  VITALS per patient if applicable: RR between 12-20 bpm  GENERAL: alert, oriented, appears well and in no acute  distress  HEENT: atraumatic, conjunctiva clear, no obvious abnormalities on inspection of external nose and ears  NECK: normal movements of the head and neck  LUNGS: on inspection no signs of respiratory distress, breathing rate appears normal, no obvious gross SOB, gasping or wheezing  CV: no obvious cyanosis  MS: moves all visible extremities without noticeable abnormality  PSYCH/NEURO: pleasant and cooperative, no obvious depression or anxiety, speech and thought processing grossly intact  ASSESSMENT AND PLAN:  Discussed the following assessment and plan:  Intractable episodic cluster headache Likely cluster headache is similar to previous episode.  Also consider sinusitis though patient without facial pain,pressure, continued nasal congestion, or rhinorrhea -Continue p.o. hydration with water and fluids -We will try Imitrex as abortive therapy. -For continued or worsening symptoms consider coming in for O2 therapy as well as follow-up with neurology - Plan: SUMAtriptan (IMITREX) 50 MG tablet  Follow-up as needed.   I discussed the assessment and treatment plan with the patient. The patient was provided an opportunity to ask questions and all were answered. The patient agreed with the plan and demonstrated an understanding of the instructions.   The patient was advised to call back or seek an in-person evaluation if the symptoms worsen or if the condition fails to improve as anticipated.  I provided 13 minutes of non-face-to-face time during this encounter.   Deeann Saint, MD

## 2020-07-09 ENCOUNTER — Telehealth: Payer: Self-pay | Admitting: Podiatry

## 2020-07-09 ENCOUNTER — Telehealth (INDEPENDENT_AMBULATORY_CARE_PROVIDER_SITE_OTHER): Payer: BC Managed Care – PPO | Admitting: Family Medicine

## 2020-07-09 DIAGNOSIS — R0981 Nasal congestion: Secondary | ICD-10-CM | POA: Diagnosis not present

## 2020-07-09 DIAGNOSIS — G8929 Other chronic pain: Secondary | ICD-10-CM

## 2020-07-09 DIAGNOSIS — R519 Headache, unspecified: Secondary | ICD-10-CM

## 2020-07-09 MED ORDER — AMOXICILLIN-POT CLAVULANATE 875-125 MG PO TABS: 1.0000 | ORAL_TABLET | Freq: Two times a day (BID) | ORAL | 0 refills | Status: DC

## 2020-07-09 NOTE — Progress Notes (Signed)
Virtual Visit via Telephone Note  I connected with Michael Ramirez on 07/09/20 at  4:40 PM EST by telephone and verified that I am speaking with the correct person using two identifiers.   I discussed the limitations, risks, security and privacy concerns of performing an evaluation and management service by telephone and the availability of in person appointments. I also discussed with the patient that there may be a patient responsible charge related to this service. The patient expressed understanding and agreed to proceed.  Location patient: home, [place] Location provider: work or home office Participants present for the call: patient, provider Patient did not have a visit with me in the prior 7 days to address this/these issue(s).   History of Present Illness:  Acute telemedicine visit for Sinus issues: -Onset: about 3 weeks ago -Symptoms include: sinus pressure and sinus headache, nasal sinus congestion, drainage in throat, ? Subjective fever today, cough, did have mild SOB the first week - none since -Denies: CP, malaise, able to get out of bed eat and drink -Has tried: OTC analgesics -Pertinent past medical history:cluster headaches per PCP, sees neurology -Pertinent medication allergies: oxycodone -COVID-19 vaccine status: fully vaccinated   Observations/Objective: Patient sounds cheerful and well on the phone. I do not appreciate any SOB. Speech and thought processing are grossly intact. Patient reported vitals:  Assessment and Plan:  Sinus congestion  Chronic nonintractable headache, unspecified headache type  -we discussed possible serious and likely etiologies, options for evaluation and workup, limitations of telemedicine visit vs in person visit, treatment, treatment risks and precautions. Pt prefers to treat via telemedicine empirically rather than in person at this moment.  Query sinusitis given duration of symptoms and not improving, versus other.  He opted for  trial of Augmentin 875 twice daily for 10 days.  He also suffers from chronic headaches, has seen neurology in the past and was diagnosed with cluster headaches.  Saw his PCP recently.  He is wondering if the sinus issues are triggering this spell of his headaches.  He plans to try to get back in with neurology if the antibiotic does not work.  I also talked with him at length about triggers for headaches, including alcohol, which he does engage in. He agrees to seek prompt in person care if worsening, new symptoms arise, or if is not improving with treatment. Advised of options for inperson care in case PCP office not available. Did let the patient know that I only do telemedicine shifts for [place] on Tuesdays and Thursdays and advised a follow up visit with PCP or at an [facility] if has further questions or concerns.    Follow Up Instructions:  I did not refer this patient for an OV with me in the next 24 hours for this/these issue(s).  I discussed the assessment and treatment plan with the patient. The patient was provided an opportunity to ask questions and all were answered. The patient agreed with the plan and demonstrated an understanding of the instructions.   I spent 17 minutes on this encounter.   Terressa Koyanagi, DO

## 2020-07-09 NOTE — Telephone Encounter (Signed)
Mr. Mayse returned to work 07/06/20, he had to leave work early, due to increased pain and swelling. Mr. Pacitti is asking for extended Long term Disability leave. He was not sure if you needed to see him again, or if you would just grant him additional time out of work. Please advise, so I can complete paperwork.

## 2020-07-09 NOTE — Patient Instructions (Signed)
-  I sent the medication(s) we discussed to your pharmacy: Meds ordered this encounter  Medications  . amoxicillin-clavulanate (AUGMENTIN) 875-125 MG tablet    Sig: Take 1 tablet by mouth 2 (two) times daily.    Dispense:  20 tablet    Refill:  0     I hope you are feeling better soon!  Seek in person care promptly if your symptoms worsen, new concerns arise or you are not improving with treatment.  It was nice to meet you today. I help [place] out with telemedicine visits on Tuesdays and Thursdays and am available for visits on those days. If you have any concerns or questions following this visit please schedule a follow up visit with your Primary Care doctor or seek care at a local urgent care clinic to avoid delays in care.   

## 2020-07-10 NOTE — Telephone Encounter (Signed)
We can do another month if he needs. The other option is seeing if he can work with restrictions. I am not sure what restrictions his work will accommodate

## 2020-07-15 DIAGNOSIS — M899 Disorder of bone, unspecified: Secondary | ICD-10-CM | POA: Diagnosis not present

## 2020-07-15 DIAGNOSIS — M25571 Pain in right ankle and joints of right foot: Secondary | ICD-10-CM | POA: Diagnosis not present

## 2020-07-15 DIAGNOSIS — M6281 Muscle weakness (generalized): Secondary | ICD-10-CM | POA: Diagnosis not present

## 2020-07-15 DIAGNOSIS — M25671 Stiffness of right ankle, not elsewhere classified: Secondary | ICD-10-CM | POA: Diagnosis not present

## 2020-07-29 DIAGNOSIS — M25671 Stiffness of right ankle, not elsewhere classified: Secondary | ICD-10-CM | POA: Diagnosis not present

## 2020-07-29 DIAGNOSIS — M6281 Muscle weakness (generalized): Secondary | ICD-10-CM | POA: Diagnosis not present

## 2020-07-29 DIAGNOSIS — M899 Disorder of bone, unspecified: Secondary | ICD-10-CM | POA: Diagnosis not present

## 2020-07-29 DIAGNOSIS — M25571 Pain in right ankle and joints of right foot: Secondary | ICD-10-CM | POA: Diagnosis not present

## 2020-08-01 ENCOUNTER — Other Ambulatory Visit: Payer: Self-pay | Admitting: Family Medicine

## 2020-08-01 DIAGNOSIS — G44011 Episodic cluster headache, intractable: Secondary | ICD-10-CM

## 2020-08-01 NOTE — Telephone Encounter (Signed)
Pt LOV was on 07/09/2020 and last refill was done on 07/05/2020 for 10 tables, please advise if ok to send refill

## 2020-08-05 DIAGNOSIS — M25671 Stiffness of right ankle, not elsewhere classified: Secondary | ICD-10-CM | POA: Diagnosis not present

## 2020-08-05 DIAGNOSIS — M899 Disorder of bone, unspecified: Secondary | ICD-10-CM | POA: Diagnosis not present

## 2020-08-05 DIAGNOSIS — M25571 Pain in right ankle and joints of right foot: Secondary | ICD-10-CM | POA: Diagnosis not present

## 2020-08-05 DIAGNOSIS — M6281 Muscle weakness (generalized): Secondary | ICD-10-CM | POA: Diagnosis not present

## 2020-08-06 ENCOUNTER — Encounter: Payer: Self-pay | Admitting: Podiatry

## 2020-08-20 ENCOUNTER — Other Ambulatory Visit: Payer: Self-pay

## 2020-08-20 DIAGNOSIS — M25671 Stiffness of right ankle, not elsewhere classified: Secondary | ICD-10-CM | POA: Diagnosis not present

## 2020-08-20 DIAGNOSIS — M6281 Muscle weakness (generalized): Secondary | ICD-10-CM | POA: Diagnosis not present

## 2020-08-20 DIAGNOSIS — M25571 Pain in right ankle and joints of right foot: Secondary | ICD-10-CM | POA: Diagnosis not present

## 2020-08-20 DIAGNOSIS — M899 Disorder of bone, unspecified: Secondary | ICD-10-CM | POA: Diagnosis not present

## 2020-08-21 ENCOUNTER — Ambulatory Visit (INDEPENDENT_AMBULATORY_CARE_PROVIDER_SITE_OTHER): Payer: BC Managed Care – PPO | Admitting: Family Medicine

## 2020-08-21 ENCOUNTER — Encounter: Payer: Self-pay | Admitting: Family Medicine

## 2020-08-21 VITALS — BP 112/80 | HR 79 | Temp 98.2°F | Wt 180.0 lb

## 2020-08-21 DIAGNOSIS — R369 Urethral discharge, unspecified: Secondary | ICD-10-CM

## 2020-08-21 DIAGNOSIS — R319 Hematuria, unspecified: Secondary | ICD-10-CM

## 2020-08-21 LAB — POCT URINALYSIS DIPSTICK
Bilirubin, UA: NEGATIVE
Glucose, UA: NEGATIVE
Ketones, UA: NEGATIVE
Leukocytes, UA: NEGATIVE
Nitrite, UA: NEGATIVE
Protein, UA: POSITIVE — AB
Spec Grav, UA: 1.025 (ref 1.010–1.025)
Urobilinogen, UA: 0.2 E.U./dL
pH, UA: 5.5 (ref 5.0–8.0)

## 2020-08-21 NOTE — Patient Instructions (Addendum)
Your urinalysis in clinic had a small amount of blood in the sample.  The sample has been sent to the lab for further testing.  While we are waiting on the results make sure you are increasing your intake of water and fluids.  Refrain from having sex until the results are back.  Hematuria, Adult Hematuria is blood in the urine. Blood may be visible in the urine, or it may be identified with a test. This condition can be caused by infections of the bladder, urethra, kidney, or prostate. Other possible causes include:  Kidney stones.  Cancer of the urinary tract.  Too much calcium in the urine.  Conditions that are passed from parent to child (inherited conditions).  Exercise that requires a lot of energy. Infections can usually be treated with medicine, and a kidney stone usually will pass through your urine. If neither of these is the cause of your hematuria, more tests may be needed to identify the cause of your symptoms. It is very important to tell your health care provider about any blood in your urine, even if it is painless or the blood stops without treatment. Blood in the urine, when it happens and then stops and then happens again, can be a symptom of a very serious condition, including cancer. There is no pain in the initial stages of many urinary cancers. Follow these instructions at home: Medicines  Take over-the-counter and prescription medicines only as told by your health care provider.  If you were prescribed an antibiotic medicine, take it as told by your health care provider. Do not stop taking the antibiotic even if you start to feel better. Eating and drinking  Drink enough fluid to keep your urine pale yellow. It is recommended that you drink 3-4 quarts (2.8-3.8 L) a day. If you have been diagnosed with an infection, drinking cranberry juice in addition to large amounts of water is recommended.  Avoid caffeine, tea, and carbonated beverages. These tend to irritate the  bladder.  Avoid alcohol because it may irritate the prostate (in males). General instructions  If you have been diagnosed with a kidney stone, follow your health care provider's instructions about straining your urine to catch the stone.  Empty your bladder often. Avoid holding urine for long periods of time.  If you are male: ? After a bowel movement, wipe from front to back and use each piece of toilet paper only once. ? Empty your bladder before and after sex.  Pay attention to any changes in your symptoms. Tell your health care provider about any changes or any new symptoms.  It is up to you to get the results of any tests. Ask your health care provider, or the department that is doing the test, when your results will be ready.  Keep all follow-up visits. This is important. Contact a health care provider if:  You develop back pain.  You have a fever or chills.  You have nausea or vomiting.  Your symptoms do not improve after 3 days.  Your symptoms get worse. Get help right away if:  You develop severe vomiting and are unable to take medicine without vomiting.  You develop severe pain in your back or abdomen even though you are taking medicine.  You pass a large amount of blood in your urine.  You pass blood clots in your urine.  You feel very weak or like you might faint.  You faint. Summary  Hematuria is blood in the urine. It has  many possible causes.  It is very important that you tell your health care provider about any blood in your urine, even if it is painless or the blood stops without treatment.  Take over-the-counter and prescription medicines only as told by your health care provider.  Drink enough fluid to keep your urine pale yellow. This information is not intended to replace advice given to you by your health care provider. Make sure you discuss any questions you have with your health care provider. Document Revised: 03/13/2020 Document  Reviewed: 03/13/2020 Elsevier Patient Education  2021 [employer].

## 2020-08-21 NOTE — Addendum Note (Signed)
Addended by: Evert Kohl D on: 08/21/2020 04:26 PM   Modules accepted: Orders

## 2020-08-21 NOTE — Progress Notes (Signed)
Subjective:    Patient ID: Michael Ramirez, male    DOB: Dec 05, 1980, 40 y.o.   MRN: [id number]  No chief complaint on file.   HPI Patient was seen today for ongoing concern.  Patient endorses mucus-like discharge after urination x 2 with a slight discomfort and suprapubic discomfort during those times.   Symptoms initially noticed after unprotected intercourse 2 wks ago and again after intercourse a few days ago.  Patient's partner asymptomatic.  Pt denies dysuria, hematuria, frequency, urgency, pain that moves,n/v.  Past Medical History:  Diagnosis Date  . Cluster headache     Allergies  Allergen Reactions  . Oxycodone Rash    ROS General: Denies fever, chills, night sweats, changes in weight, changes in appetite HEENT: Denies headaches, ear pain, changes in vision, rhinorrhea, sore throat CV: Denies CP, palpitations, SOB, orthopnea Pulm: Denies SOB, cough, wheezing GI: Denies abdominal pain, nausea, vomiting, diarrhea, constipation GU: Denies dysuria, hematuria, frequency  +penile d/c after intercourse Msk: Denies muscle cramps, joint pains Neuro: Denies weakness, numbness, tingling Skin: Denies rashes, bruising Psych: Denies depression, anxiety, hallucinations      Objective:    Blood pressure 112/80, pulse 79, temperature 98.2 F (36.8 C), temperature source Oral, weight 180 lb (81.6 kg), SpO2 97 %.  Gen. Pleasant, well-nourished, in no distress, normal affect   HEENT: [place]/AT, face symmetric, conjunctiva clear, no scleral icterus, PERRLA, EOMI, nares patent without drainage Lungs: no accessory muscle use Cardiovascular: RRR, no peripheral edema Musculoskeletal: No deformities, no cyanosis or clubbing, normal tone Neuro:  A&Ox3, CN II-XII intact, normal gait Skin:  Warm, no lesions/ rash   Wt Readings from Last 3 Encounters:  07/05/20 170 lb (77.1 kg)  10/25/19 173 lb 6.4 oz (78.7 kg)  11/01/17 170 lb (77.1 kg)    Lab Results  Component Value Date   WBC 3.5  (L) 10/25/2019   HGB 15.5 10/25/2019   HCT 46.7 10/25/2019   PLT 237.0 10/25/2019   GLUCOSE 97 10/25/2019   CHOL 249 (H) 10/25/2019   TRIG 133.0 10/25/2019   HDL 45.70 10/25/2019   LDLDIRECT 155.0 11/01/2017   LDLCALC 177 (H) 10/25/2019   ALT 17 10/25/2019   AST 14 10/25/2019   NA 138 10/25/2019   K 4.5 10/25/2019   CL 104 10/25/2019   CREATININE 1.37 10/25/2019   BUN 13 10/25/2019   CO2 30 10/25/2019   TSH 0.96 10/25/2019   HGBA1C 5.9 10/25/2019    Assessment/Plan:  Penile discharge  -Discussed possible causes including STI, renal calculi, UTI -POC UA with SG 1.025, 1+ blood, protein -safe sex practice advised. -We will obtain STI testing - Plan: POCT urinalysis dipstick, C. trachomatis/N. gonorrhoeae RNA  Hematuria, unspecified type  -Consider renal calculi, STI, UTI, renal dysfunction -1+ blood noted on POC UA - Plan: Urine Microscopic Only, Urine Culture  F/u prn  Abbe Amsterdam, MD

## 2020-08-22 LAB — URINE CULTURE
MICRO NUMBER:: 11460276
Result:: NO GROWTH
SPECIMEN QUALITY:: ADEQUATE

## 2020-08-22 LAB — URINALYSIS, MICROSCOPIC ONLY: RBC / HPF: NONE SEEN (ref 0–?)

## 2020-08-22 LAB — C. TRACHOMATIS/N. GONORRHOEAE RNA
C. trachomatis RNA, TMA: NOT DETECTED
N. gonorrhoeae RNA, TMA: NOT DETECTED

## 2020-08-26 ENCOUNTER — Other Ambulatory Visit: Payer: Self-pay

## 2020-08-26 DIAGNOSIS — R319 Hematuria, unspecified: Secondary | ICD-10-CM

## 2020-09-03 ENCOUNTER — Telehealth: Payer: Self-pay | Admitting: Podiatry

## 2020-09-03 DIAGNOSIS — H40013 Open angle with borderline findings, low risk, bilateral: Secondary | ICD-10-CM | POA: Diagnosis not present

## 2020-09-03 NOTE — Telephone Encounter (Signed)
Ok to do intermitted FMLA. He needs to come in to be seen as well if having a lot of pain.

## 2020-09-03 NOTE — Telephone Encounter (Signed)
Mr. Coopman is still experiencing a lot of pain and was looking at intermittent FMLA. He currently works 12 hour shifts and after the 2nd day, hes in a lot of pain. Please advise?

## 2020-09-13 DIAGNOSIS — M25571 Pain in right ankle and joints of right foot: Secondary | ICD-10-CM | POA: Diagnosis not present

## 2020-09-13 DIAGNOSIS — M6281 Muscle weakness (generalized): Secondary | ICD-10-CM | POA: Diagnosis not present

## 2020-09-13 DIAGNOSIS — M899 Disorder of bone, unspecified: Secondary | ICD-10-CM | POA: Diagnosis not present

## 2020-09-13 DIAGNOSIS — M25671 Stiffness of right ankle, not elsewhere classified: Secondary | ICD-10-CM | POA: Diagnosis not present

## 2020-09-16 ENCOUNTER — Ambulatory Visit: Payer: BC Managed Care – PPO | Admitting: Podiatry

## 2020-09-25 ENCOUNTER — Telehealth: Payer: Self-pay | Admitting: *Deleted

## 2020-09-25 NOTE — Telephone Encounter (Signed)
Michael Ramirez w/[employer] is requesting medical records release information(MRI,notes) for a disability claim ([id number]). Please fax to [phone]. Any questions please contact at: [phone].

## 2020-09-25 NOTE — Telephone Encounter (Signed)
Joey w/[employer] is needing the medical records MRI,office notes dated from 06/21-09/21.

## 2020-09-27 DIAGNOSIS — R3121 Asymptomatic microscopic hematuria: Secondary | ICD-10-CM | POA: Diagnosis not present

## 2020-09-30 ENCOUNTER — Other Ambulatory Visit: Payer: Self-pay

## 2020-09-30 ENCOUNTER — Ambulatory Visit (INDEPENDENT_AMBULATORY_CARE_PROVIDER_SITE_OTHER): Payer: BC Managed Care – PPO | Admitting: Podiatry

## 2020-09-30 DIAGNOSIS — M2142 Flat foot [pes planus] (acquired), left foot: Secondary | ICD-10-CM | POA: Diagnosis not present

## 2020-09-30 DIAGNOSIS — M722 Plantar fascial fibromatosis: Secondary | ICD-10-CM

## 2020-09-30 DIAGNOSIS — M2141 Flat foot [pes planus] (acquired), right foot: Secondary | ICD-10-CM | POA: Diagnosis not present

## 2020-09-30 DIAGNOSIS — M79671 Pain in right foot: Secondary | ICD-10-CM

## 2020-09-30 DIAGNOSIS — M79672 Pain in left foot: Secondary | ICD-10-CM

## 2020-09-30 MED ORDER — IBUPROFEN 800 MG PO TABS: 800.0000 mg | ORAL_TABLET | Freq: Three times a day (TID) | ORAL | 0 refills | Status: DC | PRN

## 2020-09-30 NOTE — Patient Instructions (Signed)
For instructions on how to put on your Night Splint, please visit [email or web address]  

## 2020-10-03 NOTE — Progress Notes (Signed)
Subjective: 40 year old male presents the office today for concerns of bilateral heel pain with the right side worse than left.  He states that times it feels that he is stepping on a nail to the bottom of his heel.  He stands all day at work which aggravates his symptoms.  He has no pain to press on the heel is mostly with ambulation.  He does feel that his job aggravates his symptoms.  No recent injury or falls otherwise and no other concerns today. Denies any systemic complaints such as fevers, chills, nausea, vomiting. No acute changes since last appointment, and no other complaints at this time.   Objective: AAO x3, NAD DP/PT pulses palpable bilaterally, CRT less than 3 seconds Ankle, subtalar joint range of motion intact although slightly decreased in the right side he does still have adequate range of motion.  Unable to have any tenderness to palpation along the ankle, subtalar joint as well.  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  Mild tenderness of the distal portion of the peroneal tendon.  The majority of tenderness he reports is the plantar aspect the heel he points to.  I am unable to elicit any tenderness today.  There is no edema, erythema.  Decreased medial arch upon weightbearing.  Equinus present. No pain with calf compression, swelling, warmth, erythema  Assessment: 40 year old male with plantar fasciitis, pronation  Plan: -All treatment options discussed with the patient including all alternatives, risks, complications.  -I do think his symptoms are coming from his left knee, plantar fasciitis.  Offered steroid injection today but decided to hold off on this.  Ibuprofen 800 mg as needed daily.  Continue supportive shoes, inserts.  Further physical therapy.  Night splint dispensed. -Patient is asking for intermittent FMLA.  Will complete paperwork for this. -Patient encouraged to call the office with any questions, concerns,  change in symptoms.   Vivi Barrack DPM

## 2020-10-11 ENCOUNTER — Telehealth: Payer: Self-pay | Admitting: Family Medicine

## 2020-10-11 NOTE — Telephone Encounter (Signed)
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  Pt would like to have a call back.

## 2020-10-16 ENCOUNTER — Other Ambulatory Visit: Payer: Self-pay

## 2020-10-16 ENCOUNTER — Encounter: Payer: Self-pay | Admitting: Physical Therapy

## 2020-10-16 ENCOUNTER — Ambulatory Visit: Payer: BC Managed Care – PPO | Attending: Podiatry | Admitting: Physical Therapy

## 2020-10-16 DIAGNOSIS — M79671 Pain in right foot: Secondary | ICD-10-CM | POA: Diagnosis not present

## 2020-10-16 DIAGNOSIS — M79672 Pain in left foot: Secondary | ICD-10-CM | POA: Insufficient documentation

## 2020-10-16 DIAGNOSIS — R262 Difficulty in walking, not elsewhere classified: Secondary | ICD-10-CM | POA: Insufficient documentation

## 2020-10-16 NOTE — Therapy (Signed)
[facility] [address] [place], [place], [address] Phone: [phone]   Fax:  [phone]  Physical Therapy Evaluation  Patient Details  Name: Michael Ramirez MRN: [id number] Date of Birth: 10/04/1980 Referring Provider (PT): Ovid CurdMatthew Wagoner, DPM   Encounter Date: 10/16/2020   PT End of Session - 10/16/20 1226    Visit Number 1    Number of Visits 12    Date for PT Re-Evaluation 11/27/20    Authorization Type BCBS    PT Start Time 1018    PT Stop Time 1100    PT Time Calculation (min) 42 min    Activity Tolerance Patient tolerated treatment well    Behavior During Therapy [facility] for tasks assessed/performed           Past Medical History:  Diagnosis Date  . Cluster headache     Past Surgical History:  Procedure Laterality Date  . 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    Subjective Assessment - 10/16/20 1210    Subjective Pt. is a 40 y/o male referred to PT for bilateral right>left plantar heel pain. Pt. 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 Primary pain is on right side but intermittently has left side symptoms as well. With initial onset he reports increased pain on first waking in the morning but current symptoms exacerbated primarily with prolonged standing at work. He also reports he has been having some recent (right) Achilles region pain with soreness up into his calf region and still has some mild distal peroneal tendon region soreness as well. He has been using show inserts in his work boots but continues with pain.    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 Yes    Pain Score 3     Pain Location Heel    Pain Orientation Right   plantar aspect   Pain Descriptors / Indicators Sharp;Aching    Pain Type Chronic pain    Pain Radiating Towards right calf region intermittently    Pain Onset More than a month ago    Pain Frequency Intermittent    Aggravating Factors  prolonged standing and walking    Pain Relieving Factors rest    Effect of Pain on Daily Activities increased difficulty with prolonged standing required for work duties              [facility] 10[facility]16[facility]20 0001      Assessment   Medical Diagnosis Plantar fasciitis (bilateral right>left), equinus    Referring Provider (PT) Ovid CurdMatthew Wagoner, DPM    Onset Date/Surgical Date 08/09/20   estimated per subjective report symptom onset with return to work   Next MD Visit 11/18/2020    Prior Therapy past PT last Summer and Fall s/p peroneal tendon repair surgery      Precautions   Precautions None      Restrictions   Weight Bearing Restrictions No      Balance Screen   Has the patient fallen in the past  6 months No      Prior Function   Level of Independence Independent with community mobility without device;Independent with basic ADLs    Vocation Requirements pt. works for [employer] with prolonged standing required x 12 hour shifts and lifting 50-100 lbs.      Cognition   Overall Cognitive Status Within Functional Limits for tasks assessed      Observation/Other Assessments   Focus on Therapeutic Outcomes (FOTO)  41% function      ROM / Strength   AROM / PROM / Strength AROM;PROM;Strength      AROM   AROM Assessment Site Ankle    Right/Left Ankle Right;Left    Right Ankle Dorsiflexion 0    Right Ankle Plantar Flexion 50    Right Ankle Inversion 10    Right Ankle Eversion 12    Left Ankle Dorsiflexion 8    Left Ankle Plantar  Flexion 50    Left Ankle Inversion 20    Left Ankle Eversion 16      PROM   Overall PROM Comments right ankle DF PROM to 3 deg      Strength   Strength Assessment Site Hip;Knee;Ankle    Right/Left Hip Right;Left    Right Hip Extension 4/5    Right Hip External Rotation  5/5    Right Hip Internal Rotation 5/5    Right Hip ABduction 4+/5    Right Hip ADduction 5/5    Left Hip Extension 4+/5    Left Hip External Rotation 5/5    Left Hip Internal Rotation 5/5    Left Hip ABduction 5/5    Left Hip ADduction 5/5    Right/Left Knee Right;Left    Right Knee Flexion 5/5    Right Knee Extension 5/5    Left Knee Flexion 5/5    Left Knee Extension 5/5    Right/Left Ankle Right;Left    Right Ankle Dorsiflexion 5/5    Right Ankle Plantar Flexion 5/5   supine only   Right Ankle Inversion 4+/5    Right Ankle Eversion 4/5    Left Ankle Dorsiflexion 5/5    Left Ankle Plantar Flexion 5/5    Left Ankle Inversion 5/5    Left Ankle Eversion 4+/5      Flexibility   Soft Tissue Assessment /Muscle Length --   mild gastroc tightness left>right     Palpation   Palpation comment TTP right>left plantar fascia-more tender mid-foot than at heel, trigger points medial gastroc bilaterally, hypomobility with talocural AP mobs on right, normal mobility on left      Special Tests   Other special tests (-) Windlass test      Ambulation/Gait   Gait Comments bilateral pes planus with overpronation, tendency for bilat. foot ER and decreased toe off in terminal stance                      Objective measurements completed on examination: See above findings.       [facility] Adult PT Treatment/Exercise - 10/16/20 0001      Exercises   Exercises Ankle   HEP handout review                 PT Education - 10/16/20 1226    Education Details symptom etiology, HEP, POC    Person(s) Educated Patient    Methods Explanation;Demonstration;Verbal cues;Handout    Comprehension Verbalized  understanding  PT Long Term Goals - 10/16/20 1235      PT LONG TERM GOAL #1   Title Independent with HEP    Baseline instructed at eval    Time 6    Period Weeks    Status New    Target Date 11/27/20      PT LONG TERM GOAL #2   Title Improve FOTO outcome measure score to 67% or greater functional status    Baseline 41%    Time 6    Period Weeks    Status New    Target Date 11/27/20      PT LONG TERM GOAL #3   Title Increase right ankle DF AROM at least 5 deg or greater to improve toe clearance for gait and for decreased gastroc tightness and improved ankle mobility to decrease pain with standing and walking    Baseline 0 deg    Time 6    Period Weeks    Status New    Target Date 11/27/20      PT LONG TERM GOAL #4   Title Tolerate standing as needed for work shifts with heel pain <3/10 at worst    Baseline 9-10/10 at worst with prolonged standing at work    Time 6    Period Weeks    Status New    Target Date 11/27/20      PT LONG TERM GOAL #5   Title Increase bilateral ankle strength to grossly 5/5 to improve ankle stability for outdoor ambulation over uneven surfaces    Baseline see objective    Time 6    Period Weeks    Status New    Target Date 11/27/20                  Plan - 10/16/20 1227    Clinical Impression Statement Pt. presents with bilateral right>left plantar heel pain consistent with referring dx. plantar fasciitis with contributing factors from underlying pes planus, equinus foot with tendency overpronation in addition to findings of ankle weakness and gastroc tightness. Report of more recent Achilles region pain could be consistent with Achilles tendinopathy in addition to plantar fascia pain. Pt. would benefit from PT to help relieve pain and address associated functional limitations for positional and activity tolerance.    Personal Factors and Comorbidities Profession   underlying foot mechanics and pes planus, surgical  history   Examination-Activity Limitations Lift;Locomotion Level;Stand    Examination-Participation Restrictions Occupation;Community Activity    Stability/Clinical Decision Making Evolving/Moderate complexity    Clinical Decision Making Moderate    Rehab Potential Good    PT Frequency 2x / week    PT Duration 6 weeks    PT Treatment/Interventions ADLs/Self Care Home Management;Cryotherapy;Electrical Stimulation;Iontophoresis 4mg /ml Dexamethasone;Moist Heat;Functional mobility training;Therapeutic activities;Gait training;Therapeutic exercise;Patient/family education;Manual techniques;Neuromuscular re-education;Taping;Dry needling;Passive range of motion    PT Next Visit Plan review HEP as needed, stretch plantar fascia and gastroc/soleus, manual for STM plantar fascia and gastroc (consider trigger point release medial gastroc), talocruralmobs to increase ankle DF, gastroc strengthening/eccentric, add/work on foot instrinsic ROM and strengthening, ankle strengthening, gait mechanics prn    PT Home Exercise Plan Access code: [email or web address]    Consulted and Agree with Plan of Care Patient           Patient will benefit from skilled therapeutic intervention in order to improve the following deficits and impairments:  Pain,Impaired flexibility,Increased fascial restricitons,Decreased strength,Decreased activity tolerance,Decreased range of motion,Difficulty walking,Hypomobility  Visit Diagnosis: Pain in right foot  Pain  in left foot  Difficulty in walking, not elsewhere classified     Problem List Patient Active Problem List   Diagnosis Date Noted  . Hemorrhoid 09/17/2016  . Cluster headaches 10/13/2013    Lazarus Gowda, PT, DPT 10/16/20 12:40 PM  [facility] [address] [place], [place], [address] Phone: [phone]   Fax:  [phone]  Name: Michael Ramirez MRN: [id number] Date of Birth: July 04, 1981

## 2020-10-16 NOTE — Therapy (Signed)
[facility] [address] [place], [place], [address] Phone: [phone]   Fax:  [phone]  Physical Therapy Evaluation  Patient Details  Name: Michael Ramirez MRN: [id number] Date of Birth: 06-07-1981 Referring Provider (PT): Ovid Curd, DPM   Encounter Date: 10/16/2020   PT End of Session - 10/16/20 1226    Visit Number 1    Number of Visits 12    Date for PT Re-Evaluation 11/27/20    Authorization Type BCBS, recheck FOTO visit 6    PT Start Time 1018    PT Stop Time 1100    PT Time Calculation (min) 42 min    Activity Tolerance Patient tolerated treatment well    Behavior During Therapy [facility] for tasks assessed/performed           Past Medical History:  Diagnosis Date  . Cluster headache     Past Surgical History:  Procedure Laterality Date  . 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    Subjective Assessment - 10/16/20 1210    Subjective Pt. is a 40 y/o male referred to PT for bilateral right>left plantar heel pain. Pt. 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 Primary pain is on right side but intermittently has left side symptoms as well. With initial onset he reports increased pain on first waking in the morning but current symptoms exacerbated primarily with prolonged standing at work. He also reports he has been having some recent (right) Achilles region pain with soreness up into his calf region and still has some mild distal peroneal tendon region soreness as well. He has been using show inserts in his work boots but continues with pain.    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 Yes    Pain Score 3     Pain Location Heel    Pain Orientation Right   plantar aspect   Pain Descriptors / Indicators Sharp;Aching    Pain Type Chronic pain    Pain Radiating Towards right calf region intermittently    Pain Onset More than a month ago    Pain Frequency Intermittent    Aggravating Factors  prolonged standing and walking    Pain Relieving Factors rest    Effect of Pain on Daily Activities increased difficulty with prolonged standing required for work duties              [facility] PT Assessment - 10/16/20 0001      Assessment   Medical Diagnosis Plantar fasciitis (bilateral right>left), equinus    Referring Provider (PT) Ovid Curd, DPM    Onset Date/Surgical Date 08/09/20   estimated per subjective report symptom onset with return to work   Next MD Visit 11/18/2020    Prior Therapy past PT last Summer and Fall s/p peroneal tendon repair surgery      Precautions   Precautions None      Restrictions   Weight Bearing Restrictions No      Balance Screen   Has the patient  fallen in the past 6 months No      Prior Function   Level of Independence Independent with community mobility without device;Independent with basic ADLs    Vocation Requirements pt. works for [employer] with prolonged standing required x 12 hour shifts and lifting 50-100 lbs.      Cognition   Overall Cognitive Status Within Functional Limits for tasks assessed      Observation/Other Assessments   Focus on Therapeutic Outcomes (FOTO)  41% function      ROM / Strength   AROM / PROM / Strength AROM;PROM;Strength      AROM   AROM Assessment Site Ankle    Right/Left Ankle Right;Left    Right Ankle Dorsiflexion 0    Right Ankle Plantar Flexion 50    Right Ankle Inversion 10    Right Ankle Eversion 12    Left Ankle Dorsiflexion 8     Left Ankle Plantar Flexion 50    Left Ankle Inversion 20    Left Ankle Eversion 16      PROM   Overall PROM Comments right ankle DF PROM to 3 deg      Strength   Strength Assessment Site Hip;Knee;Ankle    Right/Left Hip Right;Left    Right Hip Extension 4/5    Right Hip External Rotation  5/5    Right Hip Internal Rotation 5/5    Right Hip ABduction 4+/5    Right Hip ADduction 5/5    Left Hip Extension 4+/5    Left Hip External Rotation 5/5    Left Hip Internal Rotation 5/5    Left Hip ABduction 5/5    Left Hip ADduction 5/5    Right/Left Knee Right;Left    Right Knee Flexion 5/5    Right Knee Extension 5/5    Left Knee Flexion 5/5    Left Knee Extension 5/5    Right/Left Ankle Right;Left    Right Ankle Dorsiflexion 5/5    Right Ankle Plantar Flexion 5/5   supine only   Right Ankle Inversion 4+/5    Right Ankle Eversion 4/5    Left Ankle Dorsiflexion 5/5    Left Ankle Plantar Flexion 5/5    Left Ankle Inversion 5/5    Left Ankle Eversion 4+/5      Flexibility   Soft Tissue Assessment /Muscle Length --   mild gastroc tightness left>right     Palpation   Palpation comment TTP right>left plantar fascia-more tender mid-foot than at heel, trigger points medial gastroc bilaterally, hypomobility with talocural AP mobs on right, normal mobility on left      Special Tests   Other special tests (-) Windlass test      Ambulation/Gait   Gait Comments bilateral pes planus with overpronation, tendency for bilat. foot ER and decreased toe off in terminal stance                      Objective measurements completed on examination: See above findings.       [facility] Treatment/Exercise - 10/16/20 0001      Exercises   Exercises Ankle   HEP handout review                 PT Education - 10/16/20 1226    Education Details symptom etiology, HEP, POC, FOTO patient report    Person(s) Educated Patient    Methods Explanation;Demonstration;Verbal  cues;Handout    Comprehension Verbalized understanding  PT Long Term Goals - 10/16/20 1235      PT LONG TERM GOAL #1   Title Independent with HEP    Baseline instructed at eval    Time 6    Period Weeks    Status New    Target Date 11/27/20      PT LONG TERM GOAL #2   Title Improve FOTO outcome measure score to 67% or greater functional status    Baseline 41%    Time 6    Period Weeks    Status New    Target Date 11/27/20      PT LONG TERM GOAL #3   Title Increase right ankle DF AROM at least 5 deg or greater to improve toe clearance for gait and for decreased gastroc tightness and improved ankle mobility to decrease pain with standing and walking    Baseline 0 deg    Time 6    Period Weeks    Status New    Target Date 11/27/20      PT LONG TERM GOAL #4   Title Tolerate standing as needed for work shifts with heel pain <3/10 at worst    Baseline 9-10/10 at worst with prolonged standing at work    Time 6    Period Weeks    Status New    Target Date 11/27/20      PT LONG TERM GOAL #5   Title Increase bilateral ankle strength to grossly 5/5 to improve ankle stability for outdoor ambulation over uneven surfaces    Baseline see objective    Time 6    Period Weeks    Status New    Target Date 11/27/20                  Plan - 10/16/20 1227    Clinical Impression Statement Pt. presents with bilateral right>left plantar heel pain consistent with referring dx. plantar fasciitis with contributing factors from underlying pes planus, equinus foot with tendency overpronation in addition to findings of ankle weakness and gastroc tightness. Report of more recent Achilles region pain could be consistent with Achilles tendinopathy in addition to plantar fascia pain. Pt. would benefit from PT to help relieve pain and address associated functional limitations for positional and activity tolerance.    Personal Factors and Comorbidities Profession   underlying  foot mechanics and pes planus, surgical history   Examination-Activity Limitations Lift;Locomotion Level;Stand    Examination-Participation Restrictions Occupation;Community Activity    Stability/Clinical Decision Making Evolving/Moderate complexity    Clinical Decision Making Moderate    Rehab Potential Good    PT Frequency 2x / week    PT Duration 6 weeks    PT Treatment/Interventions ADLs/Self Care Home Management;Cryotherapy;Electrical Stimulation;Iontophoresis 4mg /ml Dexamethasone;Moist Heat;Functional mobility training;Therapeutic activities;Gait training;Therapeutic exercise;Patient/family education;Manual techniques;Neuromuscular re-education;Taping;Dry needling;Passive range of motion    PT Next Visit Plan review HEP as needed, stretch plantar fascia and gastroc/soleus, manual for STM plantar fascia and gastroc (consider trigger point release medial gastroc), talocruralmobs to increase ankle DF, gastroc strengthening/eccentric, add/work on foot instrinsic ROM and strengthening, ankle strengthening, gait mechanics prn    PT Home Exercise Plan Access code: [email or web address]    Consulted and Agree with Plan of Care Patient           Patient will benefit from skilled therapeutic intervention in order to improve the following deficits and impairments:  Pain,Impaired flexibility,Increased fascial restricitons,Decreased strength,Decreased activity tolerance,Decreased range of motion,Difficulty walking,Hypomobility  Visit Diagnosis: Pain in right foot - Plan:  PT plan of care cert/re-cert  Pain in left foot - Plan: PT plan of care cert/re-cert  Difficulty in walking, not elsewhere classified - Plan: PT plan of care cert/re-cert     Problem List Patient Active Problem List   Diagnosis Date Noted  . Hemorrhoid 09/17/2016  . Cluster headaches 10/13/2013    Lazarus Gowda, PT, DPT 10/16/20 12:43 PM  [facility] [address] [place], [place], [address] Phone: [phone]   Fax:  [phone]  Name: Michael Ramirez MRN: [id number] Date of Birth: 1981/02/09

## 2020-10-22 ENCOUNTER — Telehealth: Payer: Self-pay | Admitting: Podiatry

## 2020-10-22 ENCOUNTER — Ambulatory Visit: Payer: BC Managed Care – PPO

## 2020-10-22 NOTE — Telephone Encounter (Signed)
I called and spoke with him.   

## 2020-10-22 NOTE — Telephone Encounter (Signed)
Ok thank you 

## 2020-10-22 NOTE — Telephone Encounter (Signed)
Dr. Alvin Critchley left a voicemail stating that he wanted to speak to Dr. Ardelle Anton about this patients foot sx. He would like a call back to discuss this patients disability.

## 2020-10-22 NOTE — Telephone Encounter (Signed)
Dr. Alvin Critchley calling to discuss disability/restrictions for the patent. Please advise or give the doctor a call.

## 2020-10-22 NOTE — Telephone Encounter (Signed)
I have called and left a voicemail- no answer

## 2020-10-22 NOTE — Telephone Encounter (Signed)
Please call Dr. Alvin Critchley about patients current status

## 2020-11-05 ENCOUNTER — Encounter: Payer: Self-pay | Admitting: Physical Therapy

## 2020-11-05 ENCOUNTER — Ambulatory Visit: Payer: BC Managed Care – PPO | Attending: Podiatry | Admitting: Physical Therapy

## 2020-11-05 ENCOUNTER — Telehealth: Payer: Self-pay | Admitting: *Deleted

## 2020-11-05 ENCOUNTER — Other Ambulatory Visit: Payer: Self-pay

## 2020-11-05 DIAGNOSIS — M79671 Pain in right foot: Secondary | ICD-10-CM

## 2020-11-05 DIAGNOSIS — R262 Difficulty in walking, not elsewhere classified: Secondary | ICD-10-CM | POA: Insufficient documentation

## 2020-11-05 DIAGNOSIS — M79672 Pain in left foot: Secondary | ICD-10-CM | POA: Insufficient documentation

## 2020-11-05 NOTE — Therapy (Signed)
[facility] [address] [place], [place], [address] Phone: [phone]   Fax:  [phone]  Physical Therapy Treatment  Patient Details  Name: Michael Ramirez MRN: [id number] Date of Birth: 1981-02-14 Referring Provider (PT): Ovid Curd, DPM   Encounter Date: 11/05/2020   PT End of Session - 11/05/20 1018    Visit Number 2    Number of Visits 12    Date for PT Re-Evaluation 11/27/20    Authorization Type [employer], recheck FOTO visit 6    PT Start Time 0934    PT Stop Time 1014    PT Time Calculation (min) 40 min    Activity Tolerance Patient tolerated treatment well    Behavior During Therapy [facility] for tasks assessed/performed           Past Medical History:  Diagnosis Date  . Cluster headache     Past Surgical History:  Procedure Laterality Date  . 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   Subjective Assessment - 11/05/20 1011    Subjective Pt. reports status about the same as at eval. Of note he reports he hit his right toes on a piece of metal that was sticking out at work last night and subsequently has pain in his toes and on dorsum of foot-still awaiting status if needing to follow up with worker's comp provider.    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 Yes    Pain Score 6     Pain Location Foot    Pain Orientation Right    Pain Descriptors / Indicators Aching    Pain Type Chronic pain;Acute pain   chronic pain for heel with new onset pain last evening as noted in subjective in toes/forefoot.   Pain Onset More than a month ago    Pain Frequency Constant    Aggravating Factors  prolonged standing and walking    Pain Relieving Factors rest    Effect of Pain on Daily Activities increased difficulty walking and tolerating prolonged standing                              [facility] Adult PT Treatment/Exercise - 11/05/20 0001      Manual Therapy   Manual Therapy Joint mobilization;Soft tissue mobilization    Joint Mobilization grade III-IV talocrural AP mobilization    Soft tissue mobilization trigger point ischemic compression bilateral medial gastrocnemius, IASTM/roller use bilat. gastroc/soleus, IASTM with edge tool bilat. plantar fascia      Ankle Exercises: Stretches   Slant Board Stretch 3 reps;30 seconds   3x30 sec ea. gastroc and soleus stretches both sides   Other Stretch supine manual plantar fascia stretrches 3x30 sec ea. bilat.      Ankle Exercises: Seated   Other Seated Ankle Exercises seated "arch lift" with heel and 1st MTP floor contact x 20 reps bilat.                       PT Long Term Goals - 10/16/20 1235      PT LONG TERM GOAL #1   Title Independent with HEP    Baseline instructed at eval    Time 6    Period Weeks    Status New    Target Date 11/27/20  PT LONG TERM GOAL #2   Title Improve FOTO outcome measure score to 67% or greater functional status    Baseline 41%    Time 6    Period Weeks    Status New    Target Date 11/27/20      PT LONG TERM GOAL #3   Title Increase right ankle DF AROM at least 5 deg or greater to improve toe clearance for gait and for decreased gastroc tightness and improved ankle mobility to decrease pain with standing and walking    Baseline 0 deg    Time 6    Period Weeks    Status New    Target Date 11/27/20      PT LONG TERM GOAL #4   Title Tolerate standing as needed for work shifts with heel pain <3/10 at worst    Baseline 9-10/10 at worst with prolonged standing at work    Time 6    Period Weeks    Status New    Target Date 11/27/20      PT LONG TERM GOAL #5   Title Increase bilateral ankle strength to grossly 5/5 to improve ankle stability for outdoor ambulation over uneven surfaces    Baseline see objective    Time 6    Period  Weeks    Status New    Target Date 11/27/20                 Plan - 11/05/20 1018    Clinical Impression Statement Pt. presents for 2nd therapy visit/first follow up since initial eval-no significant improvement but given symptom duration expect progress will be gradual. Pt. also with new onset pain in toes and on dorsume of foot as noted in subjective-still able to complete session as needed today though held ankle distraction mobilization to avoid direct pressure to region of pain-will await further status if pt. is referred to follow up with MD re: this as needed. Otherwise plan continue PT as tolerated to help ease foot/heel pain and address associated functional limitations for standing and walking toelrance.    Personal Factors and Comorbidities Profession    Examination-Activity Limitations Lift;Locomotion Level;Stand    Examination-Participation Restrictions Occupation;Community Activity    Stability/Clinical Decision Making Evolving/Moderate complexity    Clinical Decision Making Moderate    Rehab Potential Good    PT Frequency 2x / week    PT Duration 6 weeks    PT Treatment/Interventions ADLs/Self Care Home Management;Cryotherapy;Electrical Stimulation;Iontophoresis 4mg /ml Dexamethasone;Moist Heat;Functional mobility training;Therapeutic activities;Gait training;Therapeutic exercise;Patient/family education;Manual techniques;Neuromuscular re-education;Taping;Dry needling;Passive range of motion    PT Next Visit Plan Monitor new onset foot/toe pain, continue manual with ankle mobs and STM, stretches, work on foot intrinsic and ankle strengthening and stability as tolerated    PT Home Exercise Plan Access code: [email or web address]    Consulted and Agree with Plan of Care Patient           Patient will benefit from skilled therapeutic intervention in order to improve the following deficits and impairments:  Pain,Impaired flexibility,Increased fascial restricitons,Decreased  strength,Decreased activity tolerance,Decreased range of motion,Difficulty walking,Hypomobility  Visit Diagnosis: Pain in right foot  Pain in left foot  Difficulty in walking, not elsewhere classified     Problem List Patient Active Problem List   Diagnosis Date Noted  . Hemorrhoid 09/17/2016  . Cluster headaches 10/13/2013    10/15/2013, PT, DPT 11/05/20 10:23 AM  [facility] [address] [place], [place], [place] Phone: [phone]  Fax:  [phone]  Name: Michael Ramirez MRN: [id number] Date of Birth: [birth year]

## 2020-11-05 NOTE — Telephone Encounter (Signed)
I don't see where he had surgery, he should be ok until Thursday when we can evaluate it

## 2020-11-05 NOTE — Telephone Encounter (Signed)
Patient is calling because he has  Injured his surgery foot at work after tripping on a piece of medal object  l day ago, now it is irritated more.  Please schedule/confirmed  for an upcoming appointment on  Thursday at 9:15.

## 2020-11-06 DIAGNOSIS — R3121 Asymptomatic microscopic hematuria: Secondary | ICD-10-CM | POA: Diagnosis not present

## 2020-11-07 ENCOUNTER — Other Ambulatory Visit: Payer: Self-pay

## 2020-11-07 ENCOUNTER — Ambulatory Visit: Payer: BC Managed Care – PPO | Admitting: Podiatry

## 2020-11-07 ENCOUNTER — Ambulatory Visit: Payer: BC Managed Care – PPO | Admitting: Physical Therapy

## 2020-11-07 ENCOUNTER — Encounter (HOSPITAL_COMMUNITY): Payer: Self-pay | Admitting: Emergency Medicine

## 2020-11-07 ENCOUNTER — Emergency Department (HOSPITAL_COMMUNITY)
Admission: EM | Admit: 2020-11-07 | Discharge: 2020-11-07 | Disposition: A | Discharge status: Home / Self Care | Payer: No Typology Code available for payment source | Attending: Emergency Medicine | Admitting: Emergency Medicine

## 2020-11-07 ENCOUNTER — Emergency Department (HOSPITAL_COMMUNITY): Payer: No Typology Code available for payment source

## 2020-11-07 DIAGNOSIS — W268XXA Contact with other sharp object(s), not elsewhere classified, initial encounter: Secondary | ICD-10-CM | POA: Diagnosis not present

## 2020-11-07 DIAGNOSIS — S99921A Unspecified injury of right foot, initial encounter: Secondary | ICD-10-CM | POA: Diagnosis present

## 2020-11-07 DIAGNOSIS — S9031XA Contusion of right foot, initial encounter: Secondary | ICD-10-CM | POA: Diagnosis not present

## 2020-11-07 DIAGNOSIS — Z87891 Personal history of nicotine dependence: Secondary | ICD-10-CM | POA: Insufficient documentation

## 2020-11-07 DIAGNOSIS — Y990 Civilian activity done for income or pay: Secondary | ICD-10-CM | POA: Diagnosis not present

## 2020-11-07 MED ORDER — ACETAMINOPHEN 325 MG PO TABS
650.0000 mg | ORAL_TABLET | Freq: Once | ORAL | Status: AC
  Administered 2020-11-07: 650 mg via ORAL
  Filled 2020-11-07: qty 2

## 2020-11-07 MED ORDER — DICLOFENAC SODIUM 1 % EX GEL: 2.0000 g | Freq: Four times a day (QID) | CUTANEOUS | 0 refills | Status: DC

## 2020-11-07 NOTE — ED Triage Notes (Signed)
Emergency Medicine Provider Triage Evaluation Note  Michael Ramirez , a 40 y.o. male  was evaluated in triage.  Pt complains of right toe pain.  Review of Systems  Positive: Right foot pain Negative: Numbness, tingling  Physical Exam  BP 140/84 (BP Location: Right Arm)   Pulse 82   Temp 98.9 F (37.2 C) (Oral)   Resp 18   SpO2 100%  Gen:   Awake, no distress   HEENT:  Atraumatic  Resp:  Normal effort  Cardiac:  Normal rate  Abd:   Nondistended, nontender  MSK:   Patient in cam walker boot Neuro:  Speech clear   Medical Decision Making  Medically screening exam initiated at 3:52 PM.  Appropriate orders placed.  Alvin Critchley was informed that the remainder of the evaluation will be completed by another provider, this initial triage assessment does not replace that evaluation, and the importance of remaining in the ED until their evaluation is complete.  Clinical Impression  40 year old male here with right toe and right foot pain after hitting it on a metal piece at work.  Recent history of foot surgery.  Will order x-ray.  Stable for further evaluation.   Mare Ferrari, PA-C 11/07/20 1556

## 2020-11-07 NOTE — ED Provider Notes (Signed)
[facility] EMERGENCY DEPARTMENT Provider Note   CSN: [id number] Arrival date & time: 11/07/20  1537     History Chief Complaint  Patient presents with  . Foot Pain    Michael Ramirez is a 40 y.o. male who presents to ED with a chief complaint of right foot pain.  States that 4 days ago was at work when he actually put on a piece of metal that was on the floor.  States he has had surgery on his right foot which included a tendon repair back in August 2021.  He has been taking ibuprofen for pain.  He tried to get in touch with his foot specialist and was scheduled for an appointment today.  However due to traffic he was unable to make it there so he decided to come to the ER instead.  He denies any numbness, changes to sensation, paresthesias, weakness, fever or changes to gait.  He has been wearing his postop boot  HPI     Past Medical History:  Diagnosis Date  . Cluster headache     Patient Active Problem List   Diagnosis Date Noted  . Hemorrhoid 09/17/2016  . Cluster headaches 10/13/2013    Past Surgical History:  Procedure Laterality Date  . 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 Hypertension Father   . Anxiety disorder Father   . Heart disease Paternal Grandmother   . Cancer Paternal Grandfather     Social History   Tobacco Use  . Smoking status: Former Smoker    Quit date: 11/25/2015    Years since quitting: 4.9  . Smokeless tobacco: Former Clinical biochemist  . Vaping Use: Never used  Substance Use Topics  . Alcohol use: Yes    Alcohol/week: 6.0 standard drinks    Types: 6 Shots of liquor per week    Comment: Fifth  every other weekend   . Drug use: Yes    Types: Marijuana    Home Medications Prior to Admission medications   Medication Sig Start Date End Date Taking? Authorizing Provider  diclofenac Sodium (VOLTAREN) 1 % GEL  Apply 2 g topically 4 (four) times daily. 11/07/20  Yes Shanai Lartigue, PA-C  ibuprofen (ADVIL) 800 MG tablet Take 1 tablet (800 mg total) by mouth every 8 (eight) hours as needed. 09/30/20   Vivi Barrack, DPM    Allergies    Oxycodone  Review of Systems   Review of Systems  Constitutional: Negative for chills and fever.  Musculoskeletal: Positive for arthralgias and myalgias.  Neurological: Negative for weakness and numbness.    Physical Exam Updated Vital Signs BP 140/84 (BP Location: Right Arm)   Pulse 82   Temp 98.9 F (37.2 C) (Oral)   Resp 18   SpO2 100%   Physical Exam Vitals and nursing note reviewed.  Constitutional:      General: He is not in acute distress.    Appearance: He is well-developed. He is not diaphoretic.  HENT:     Head: Normocephalic and atraumatic.  Eyes:     General: No scleral icterus.    Conjunctiva/sclera: Conjunctivae normal.  Pulmonary:     Effort: Pulmonary effort is normal. No respiratory distress.  Musculoskeletal:        General: Tenderness present.     Cervical back: Normal range of motion.  Feet:  Feet:     Comments: Tenderness to palpation at the base of all the digits in the right foot.  No overlying skin changes.  Able to move digits and ankle without difficulty.  Normal sensation to light touch.  2+ DP pulse noted bilaterally.  Strength 5/5 in bilateral lower extremities. Skin:    Findings: No rash.  Neurological:     Mental Status: He is alert.     ED Results / Procedures / Treatments   Labs (all labs ordered are listed, but only abnormal results are displayed) Labs Reviewed - No data to display  EKG None  Radiology DG Foot Complete Right  Result Date: 11/07/2020 CLINICAL DATA:  Pain after hitting foot against solid object EXAM: RIGHT FOOT COMPLETE - 3+ VIEW COMPARISON:  March 04, 2020 FINDINGS: Frontal, oblique, and lateral views were obtained. No appreciable fracture or dislocation. Joint spaces appear  normal. No erosive change. IMPRESSION: No fracture or dislocation.  No evident arthropathy. Electronically Signed   By: Bretta Bang III M.D.   On: 11/07/2020 16:13    Procedures Procedures   Medications Ordered in ED Medications  acetaminophen (TYLENOL) tablet 650 mg (has no administration in time range)    ED Course  I have reviewed the triage vital signs and the nursing notes.  Pertinent labs & imaging results that were available during my care of the patient were reviewed by me and considered in my medical decision making (see chart for details).    MDM Rules/Calculators/A&P                          39 year old male presenting to the ED for right foot pain after accidentally hitting his toes at work earlier this week.  He has had surgery on this foot before including a tendon repair back in August.  He remains ambulatory but has been wearing his postop boot.  Has been taking NSAIDs.  Was scheduled to see his orthopedist today but due to traffic missed the appointment and came to the ER instead.  On exam there is tenderness palpation of the base of the metatarsals without changes to range of motion or sensation.  No overlying skin changes.  Equal and intact distal pulses.  X-rays negative for acute abnormalities.  Suspect contusion as a cause of his symptoms.  Doubt infectious or vascular cause will treat with Voltaren gel and give follow-up with his specialist.  Return precautions given.  All imaging, if done today, including plain films, CT scans, and ultrasounds, independently reviewed by me, and interpretations confirmed via formal radiology reads.  Patient is hemodynamically stable, in NAD, and able to ambulate in the ED. Evaluation does not show pathology that would require ongoing emergent intervention or inpatient treatment. I explained the diagnosis to the patient. Pain has been managed and has no complaints prior to discharge. Patient is comfortable with above plan and is  stable for discharge at this time. All questions were answered prior to disposition. Strict return precautions for returning to the ED were discussed. Encouraged follow up with PCP.   An After Visit Summary was printed and given to the patient.   Portions of this note were generated with Scientist, clinical (histocompatibility and immunogenetics). Dictation errors may occur despite best attempts at proofreading.  Final Clinical Impression(s) / ED Diagnoses Final diagnoses:  Contusion of right foot, initial encounter    Rx / DC Orders ED Discharge Orders  Ordered    diclofenac Sodium (VOLTAREN) 1 % GEL  4 times daily        11/07/20 1910           Dietrich Pates, Cordelia Poche 11/07/20 1911    Cathren Laine, MD 11/07/20 1914

## 2020-11-07 NOTE — Discharge Instructions (Addendum)
Take the medicine as directed. Follow-up with your foot specialist. Return to the ER if you start to experience worsening pain, additional injuries, numbness or weakness

## 2020-11-07 NOTE — ED Triage Notes (Signed)
Pt c/o foot pain after accidentally hitting his foot on something at work. Pt with a boot to right foot already, due to surgical history.

## 2020-11-10 ENCOUNTER — Emergency Department (HOSPITAL_BASED_OUTPATIENT_CLINIC_OR_DEPARTMENT_OTHER): Payer: No Typology Code available for payment source | Admitting: Radiology

## 2020-11-10 ENCOUNTER — Encounter (HOSPITAL_BASED_OUTPATIENT_CLINIC_OR_DEPARTMENT_OTHER): Payer: Self-pay | Admitting: Emergency Medicine

## 2020-11-10 ENCOUNTER — Emergency Department (HOSPITAL_BASED_OUTPATIENT_CLINIC_OR_DEPARTMENT_OTHER)
Admission: EM | Admit: 2020-11-10 | Discharge: 2020-11-10 | Disposition: A | Discharge status: Home / Self Care | Payer: No Typology Code available for payment source | Attending: Emergency Medicine | Admitting: Emergency Medicine

## 2020-11-10 ENCOUNTER — Other Ambulatory Visit: Payer: Self-pay

## 2020-11-10 DIAGNOSIS — W010XXA Fall on same level from slipping, tripping and stumbling without subsequent striking against object, initial encounter: Secondary | ICD-10-CM | POA: Diagnosis not present

## 2020-11-10 DIAGNOSIS — S86911A Strain of unspecified muscle(s) and tendon(s) at lower leg level, right leg, initial encounter: Secondary | ICD-10-CM

## 2020-11-10 DIAGNOSIS — Z87891 Personal history of nicotine dependence: Secondary | ICD-10-CM | POA: Insufficient documentation

## 2020-11-10 DIAGNOSIS — S99911A Unspecified injury of right ankle, initial encounter: Secondary | ICD-10-CM | POA: Diagnosis present

## 2020-11-10 MED ORDER — KETOROLAC TROMETHAMINE 10 MG PO TABS
10.0000 mg | ORAL_TABLET | Freq: Once | ORAL | Status: AC
  Administered 2020-11-10: 10 mg via ORAL
  Filled 2020-11-10: qty 1

## 2020-11-10 NOTE — ED Triage Notes (Signed)
Pt had recent surgery on his right foot. Pt stated that the pain in his right foot that is radiating up his leg. Pt stated that he went to [facility] ED earlier this week for pain and returns to [facility] for same today.

## 2020-11-10 NOTE — ED Notes (Signed)
Pt discharged home after verbalizing understanding of discharge instructions; nad noted. 

## 2020-11-10 NOTE — ED Provider Notes (Signed)
[facility] EMERGENCY DEPT Provider Note   CSN: [id number] Arrival date & time: 11/10/20  0859     History Chief Complaint  Patient presents with  . Foot Pain    Michael Ramirez is a 40 y.o. male.  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  He was evaluated for this injury several days ago and had a normal foot x-ray.  Pain has worsened and is now radiating up his entire right leg.   Ankle Pain Location:  Ankle Time since incident:  3 days Injury: yes   Mechanism of injury comment:  Tripped on a strip of metal; forced plantarflexion of the ankle Ankle location:  R ankle Pain details:    Quality:  Shooting   Radiates to:  R leg (thigh and lower leg)   Severity:  Severe   Onset quality:  Sudden   Duration:  3 days   Timing:  Constant   Progression:  Worsening Chronicity:  New Prior injury to area:  Yes Relieved by:  Rest Worsened by:  Bearing weight Ineffective treatments:  Elevation Associated symptoms: decreased ROM and tingling (toes, last night)   Associated symptoms: no back pain, no fever and no numbness        Past Medical History:  Diagnosis Date  . Cluster headache     Patient Active Problem List   Diagnosis Date Noted  . Hemorrhoid 09/17/2016  . Cluster headaches 10/13/2013    Past Surgical History:  Procedure Laterality Date  . 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 Hypertension Father   . Anxiety disorder Father   . Heart disease Paternal Grandmother   . Cancer Paternal Grandfather     Social History   Tobacco Use  . Smoking status: Former Smoker    Quit date: 11/25/2015    Years since quitting: 4.9  . Smokeless tobacco: Former Clinical biochemist  . Vaping Use: Never used  Substance Use Topics  . Alcohol use: Yes    Alcohol/week: 6.0 standard drinks     Types: 6 Shots of liquor per week    Comment: Fifth  every other weekend   . Drug use: Yes    Types: Marijuana    Home Medications Prior to Admission medications   Medication Sig Start Date End Date Taking? Authorizing Provider  diclofenac Sodium (VOLTAREN) 1 % GEL Apply 2 g topically 4 (four) times daily. 11/07/20   Khatri, Hina, PA-C  ibuprofen (ADVIL) 800 MG tablet Take 1 tablet (800 mg total) by mouth every 8 (eight) hours as needed. 09/30/20   Vivi Barrack, DPM    Allergies    Oxycodone  Review of Systems   Review of Systems  Constitutional: Negative for chills and fever.  HENT: Negative for ear pain and sore throat.   Eyes: Negative for pain and visual disturbance.  Respiratory: Negative for cough and shortness of breath.   Cardiovascular: Negative for chest pain and palpitations.  Gastrointestinal: Negative for abdominal pain and vomiting.  Genitourinary: Negative for dysuria and hematuria.  Musculoskeletal: Negative for arthralgias and back pain.  Skin: Negative for color change and rash.  Neurological: Negative for seizures and syncope.  All other systems reviewed and are negative.   Physical Exam Updated Vital Signs BP (!) 143/88 (BP Location: Right Arm)   Pulse (!) 59   Temp 98.1  F (36.7 C) (Oral)   Resp 18   Ht 5\' 7"  (1.702 m)   Wt 78.9 kg   SpO2 100%   BMI 27.25 kg/m   Physical Exam Vitals and nursing note reviewed.  Constitutional:      Appearance: Normal appearance.  HENT:     Head: Normocephalic and atraumatic.  Eyes:     Conjunctiva/sclera: Conjunctivae normal.  Pulmonary:     Effort: Pulmonary effort is normal. No respiratory distress.  Musculoskeletal:        General: No deformity. Normal range of motion.     Cervical back: Normal range of motion.     Comments: The right foot and ankle is normal to inspection.  There are well-healed incisions at the lateral ankle and the anterolateral aspect of the dorsum of the foot.  Distal pulses are  full and symmetric with respect to the opposite side.  He has mild tenderness to palpation at the anterior joint line as well as the lateral ankle ligament complex.  Range of motion is very less slightly limited for all ankle range of motion but not secondary to pain.  No instability noted with anterior drawer testing and lateral tilt testing.  No tenderness to palpation about the tibia or calf.  Skin:    General: Skin is warm and dry.  Neurological:     General: No focal deficit present.     Mental Status: He is alert and oriented to person, place, and time. Mental status is at baseline.  Psychiatric:        Mood and Affect: Mood normal.     ED Results / Procedures / Treatments   Labs (all labs ordered are listed, but only abnormal results are displayed) Labs Reviewed - No data to display  EKG None  Radiology DG Lumbar Spine Complete  Result Date: 11/10/2020 CLINICAL DATA:  Recent injury, RIGHT radicular pain. EXAM: LUMBAR SPINE - COMPLETE 4+ VIEW COMPARISON:  None. FINDINGS: Alignment of the lumbar spine is within normal limits. No evidence of acute vertebral body subluxation. No fracture line or displaced fracture fragment. No evidence of pars interarticularis defect. Disc spaces appear well maintained. No evidence of degenerative change. Visualized paravertebral soft tissues are unremarkable. IMPRESSION: Normal plain film examination of the lumbar spine. No acute findings. Electronically Signed   By: 11/12/2020 M.D.   On: 11/10/2020 10:02   DG Ankle Complete Right  Result Date: 11/10/2020 CLINICAL DATA:  Recent injury, pain. EXAM: RIGHT ANKLE - COMPLETE 3+ VIEW COMPARISON:  None. FINDINGS: Three views of the RIGHT ankle are provided. Osseous alignment is normal. Ankle mortise is symmetric. No fracture line or displaced fracture fragment is seen. No degenerative change. Visualized portions of the hindfoot and midfoot appear intact and normally aligned. Adjacent soft tissues are  unremarkable. IMPRESSION: Negative. Electronically Signed   By: 11/12/2020 M.D.   On: 11/10/2020 10:01    Procedures Procedures   Medications Ordered in ED Medications  ketorolac (TORADOL) tablet 10 mg (has no administration in time range)    ED Course  I have reviewed the triage vital signs and the nursing notes.  Pertinent labs & imaging results that were available during my care of the patient were reviewed by me and considered in my medical decision making (see chart for details).    MDM Rules/Calculators/A&P                          11/12/2020  presents with ankle pain after an injury.  He does have a history of surgery related to a peroneal tendon injury and tarsal coalition.  This is a second ED visit.  Pain seems to be related to his ankle rather than his foot, and an ankle x-ray was obtained.  No evidence of osteochondral defect, fracture, or acute pathology.  No effusion to suggest intra-articular pathology either.  Based on the mechanism of injury, I am suspicious for tibialis anterior strain.  The patient does have an upcoming podiatry appointment.  He was also complaining of pain of his entire right leg and some shocking sensations in his toe.  He denies any back pain, and he has full strength of this extremity.  I think this is likely secondary to compensating for his ankle pain, but I did obtain a lumbar spine x-ray to exclude any pathology in his low back.  I do think he is suitable for discharge home, and I have provided an ASO for extra support. Final Clinical Impression(s) / ED Diagnoses Final diagnoses:  Strain of tendon of right lower extremity, initial encounter    Rx / DC Orders ED Discharge Orders    None       Koleen Distance, MD 11/10/20 1028

## 2020-11-11 DIAGNOSIS — R3121 Asymptomatic microscopic hematuria: Secondary | ICD-10-CM | POA: Diagnosis not present

## 2020-11-12 ENCOUNTER — Other Ambulatory Visit: Payer: Self-pay

## 2020-11-12 ENCOUNTER — Ambulatory Visit: Payer: BC Managed Care – PPO

## 2020-11-12 DIAGNOSIS — R262 Difficulty in walking, not elsewhere classified: Secondary | ICD-10-CM | POA: Diagnosis not present

## 2020-11-12 DIAGNOSIS — M79672 Pain in left foot: Secondary | ICD-10-CM | POA: Diagnosis not present

## 2020-11-12 DIAGNOSIS — M79671 Pain in right foot: Secondary | ICD-10-CM

## 2020-11-13 NOTE — Therapy (Signed)
[facility] Outpatient Rehabilitation [facility] [address] [place], [place], [address] Phone: [phone]   Fax:  [phone]  Physical Therapy Treatment  Patient Details  Name: Michael Ramirez MRN: [id number] Date of Birth: 1981/07/11 Referring Provider (PT): Ovid Curd, DPM   Encounter Date: 11/12/2020   PT End of Session - 11/12/20 1139    Visit Number 3    Number of Visits 12    Date for PT Re-Evaluation 11/27/20    Authorization Type BCBS, recheck FOTO visit 6    PT Start Time 1139    PT Stop Time 1219    PT Time Calculation (min) 40 min    Activity Tolerance Patient tolerated treatment well    Behavior During Therapy [facility] for tasks assessed/performed           Past Medical History:  Diagnosis Date  . Cluster headache     Past Surgical History:  Procedure Laterality Date  . 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   Subjective Assessment - 11/12/20 1142    Subjective Pt reports the dorsum of the R foot from recent injury is continuing to bother him significantly with extended standing.    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 Yes    Pain Score 2     Pain Location Heel    Pain Orientation Right    Pain Descriptors / Indicators Aching    Pain Type Acute pain    Pain Radiating Towards right calf region intermittently    Pain Frequency Constant    Aggravating Factors  prolonged standing and walking    Pain Relieving Factors rest    Effect of Pain on Daily Activities increased difficulty walking and tolerating prolonged standing    Multiple Pain Sites Yes    Pain Score 8    Pain Location Foot    Pain Orientation Right    Pain Descriptors / Indicators Aching;Sharp    Pain Type Acute pain    Pain Onset 1 to 4 weeks  ago    Pain Frequency Constant    Aggravating Factors  Prolonged standing    Pain Relieving Factors Rest                             [facility] Adult PT Treatment/Exercise - 11/13/20 0001      Ambulation/Gait   Gait Comments Antalgic gait pattern , R foot> L      Exercises   Exercises Ankle   HEP handout review     Manual Therapy   Soft tissue mobilization STM and gentle cross friction massage to the dorsum of the R foot and STM to the R calf with trigger point release      Ankle Exercises: Stretches   Gastroc Stretch 3 reps;20 seconds   VC for stretch without heel pain   Other Stretch Pt completed self manual stetching into ankle/foot/toe DFand PF 3 resp each x 30 sec      Ankle Exercises: Seated   Towel Crunch --   R toes 20 reps   Other Seated Ankle Exercises R seated "arch lift" with heel and 1st MTP floor contact x 20 reps bilat.  PT Education - 11/13/20 0959    Education Details Updated HEP    Person(s) Educated Patient    Methods Explanation;Demonstration;Tactile cues;Verbal cues;Handout    Comprehension Verbalized understanding;Returned demonstration;Verbal cues required;Tactile cues required               PT Long Term Goals - 10/16/20 1235      PT LONG TERM GOAL #1   Title Independent with HEP    Baseline instructed at eval    Time 6    Period Weeks    Status New    Target Date 11/27/20      PT LONG TERM GOAL #2   Title Improve FOTO outcome measure score to 67% or greater functional status    Baseline 41%    Time 6    Period Weeks    Status New    Target Date 11/27/20      PT LONG TERM GOAL #3   Title Increase right ankle DF AROM at least 5 deg or greater to improve toe clearance for gait and for decreased gastroc tightness and improved ankle mobility to decrease pain with standing and walking    Baseline 0 deg    Time 6    Period Weeks    Status New    Target Date 11/27/20      PT LONG TERM GOAL #4    Title Tolerate standing as needed for work shifts with heel pain <3/10 at worst    Baseline 9-10/10 at worst with prolonged standing at work    Time 6    Period Weeks    Status New    Target Date 11/27/20      PT LONG TERM GOAL #5   Title Increase bilateral ankle strength to grossly 5/5 to improve ankle stability for outdoor ambulation over uneven surfaces    Baseline see objective    Time 6    Period Weeks    Status New    Target Date 11/27/20                 Plan - 11/12/20 1140    Clinical Impression Statement Pt presents with pain to the dorsum of the R foot being greater than the R heel and calf pain.R dorsum pain appears related to trauma/strain to the R EDL. Pt receied STM and gentle cross friction masage to this area as well as to the R calf. Pt was instructed in gentle manual self stretching into PF of the ankle, foot, toes. Strenghtening exs of the R foot was continued. Pt reports he is working modified duty which is 2, 12 hour days vs. 3 days. Pt tolerated today's PT session without adverse effects.    Personal Factors and Comorbidities Profession    Examination-Activity Limitations Lift;Locomotion Level;Stand    Examination-Participation Restrictions Occupation;Community Activity    Stability/Clinical Decision Making Evolving/Moderate complexity    Clinical Decision Making Moderate    Rehab Potential Good    PT Frequency 2x / week    PT Duration 6 weeks    PT Treatment/Interventions ADLs/Self Care Home Management;Cryotherapy;Electrical Stimulation;Iontophoresis 4mg /ml Dexamethasone;Moist Heat;Functional mobility training;Therapeutic activities;Gait training;Therapeutic exercise;Patient/family education;Manual techniques;Neuromuscular re-education;Taping;Dry needling;Passive range of motion    PT Next Visit Plan Monitor new onset foot/toe pain, continue manual with ankle mobs and STM, stretches, work on foot intrinsic and ankle strengthening and stability as tolerated     PT Home Exercise Plan Access code: [email or web address]    Consulted and Agree with Plan of Care Patient  Patient will benefit from skilled therapeutic intervention in order to improve the following deficits and impairments:  Pain,Impaired flexibility,Increased fascial restricitons,Decreased strength,Decreased activity tolerance,Decreased range of motion,Difficulty walking,Hypomobility  Visit Diagnosis: Pain in right foot  Pain in left foot  Difficulty in walking, not elsewhere classified     Problem List Patient Active Problem List   Diagnosis Date Noted  . Hemorrhoid 09/17/2016  . Cluster headaches 10/13/2013    Joellyn Rued MS, PT 11/13/20 10:21 AM  [facility] Outpatient Rehabilitation [facility] [address] [place], [place], [address] Phone: [phone]   Fax:  [phone]  Name: Kruze Atchley MRN: [id number] Date of Birth: June 18, 1981

## 2020-11-14 ENCOUNTER — Ambulatory Visit (INDEPENDENT_AMBULATORY_CARE_PROVIDER_SITE_OTHER): Payer: No Typology Code available for payment source

## 2020-11-14 ENCOUNTER — Other Ambulatory Visit: Payer: Self-pay

## 2020-11-14 ENCOUNTER — Ambulatory Visit (INDEPENDENT_AMBULATORY_CARE_PROVIDER_SITE_OTHER): Payer: No Typology Code available for payment source | Admitting: Podiatry

## 2020-11-14 DIAGNOSIS — S99921A Unspecified injury of right foot, initial encounter: Secondary | ICD-10-CM

## 2020-11-14 DIAGNOSIS — M79672 Pain in left foot: Secondary | ICD-10-CM

## 2020-11-14 DIAGNOSIS — M7751 Other enthesopathy of right foot: Secondary | ICD-10-CM

## 2020-11-14 DIAGNOSIS — M778 Other enthesopathies, not elsewhere classified: Secondary | ICD-10-CM | POA: Diagnosis not present

## 2020-11-14 MED ORDER — IBUPROFEN 800 MG PO TABS: 800.0000 mg | ORAL_TABLET | Freq: Three times a day (TID) | ORAL | 0 refills | Status: DC | PRN

## 2020-11-14 MED ORDER — TRIAMCINOLONE ACETONIDE 10 MG/ML IJ SUSP
10.0000 mg | Freq: Once | INTRAMUSCULAR | Status: AC
  Administered 2020-11-14: 10 mg

## 2020-11-14 MED ORDER — TRAMADOL HCL 50 MG PO TABS: 50.0000 mg | ORAL_TABLET | Freq: Two times a day (BID) | ORAL | 0 refills | Status: AC | PRN

## 2020-11-15 ENCOUNTER — Telehealth: Payer: Self-pay | Admitting: Podiatry

## 2020-11-15 NOTE — Telephone Encounter (Signed)
Patient had a incident at work on 11/04/2020 while getting on the elevator, he tripped and injured his surgical foot. He is now working with a Clinical research associate for [employer]. Mr. Cahall, said he has been out of work from 11/04/20 and needs to continue out of work, while wearing Cam Boot until next visit of 12/12/2020. He needs a note for his Lawyer stating this information. I was just making sure the dates are correct, should I begin his out of work 11/04/20 or on his visit date of 11/14/2020. Please advise?

## 2020-11-18 ENCOUNTER — Ambulatory Visit: Payer: BC Managed Care – PPO | Admitting: Podiatry

## 2020-11-18 NOTE — Telephone Encounter (Signed)
That is correct 

## 2020-11-19 ENCOUNTER — Other Ambulatory Visit: Payer: Self-pay

## 2020-11-19 ENCOUNTER — Ambulatory Visit: Payer: BC Managed Care – PPO

## 2020-11-19 DIAGNOSIS — M79671 Pain in right foot: Secondary | ICD-10-CM | POA: Diagnosis not present

## 2020-11-19 DIAGNOSIS — M79672 Pain in left foot: Secondary | ICD-10-CM | POA: Diagnosis not present

## 2020-11-19 DIAGNOSIS — R262 Difficulty in walking, not elsewhere classified: Secondary | ICD-10-CM

## 2020-11-19 NOTE — Therapy (Signed)
[facility] Outpatient Rehabilitation [facility] [address] [place], [place], [address] Phone: [phone]   Fax:  [phone]  Physical Therapy Treatment  Patient Details  Name: Michael Ramirez MRN: [id number] Date of Birth: [birth year] Referring Provider (PT): Ovid Curd, DPM   Encounter Date: 11/19/2020   PT End of Session - 11/19/20 1139    Visit Number 4    Number of Visits 12    Date for PT Re-Evaluation 11/27/20    Authorization Type BCBS, recheck FOTO visit 6    PT Start Time 1136    PT Stop Time 1217    PT Time Calculation (min) 41 min    Activity Tolerance Patient tolerated treatment well    Behavior During Therapy [facility] for tasks assessed/performed           Past Medical History:  Diagnosis Date  . Cluster headache     Past Surgical History:  Procedure Laterality Date  . 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   Subjective Assessment - 11/19/20 1140    Subjective Pt reports receiving a cortisone injection 4/20 and if help the pain of his dorsal foot for approx 1 day. Pt indicates his pain is about about the same    Patient Stated Goals Resolve heel pain, work without pain    Currently in Pain? Yes    Pain Score 6     Pain Location Heel    Pain Orientation Right    Pain Descriptors / Indicators Aching    Pain Type Acute pain    Pain Onset More than a month ago    Pain Frequency Constant    Pain Score 7    Pain Location Foot    Pain Orientation Right    Pain Descriptors / Indicators Aching;Sharp    Pain Type Acute pain    Pain Onset 1 to 4 weeks ago    Pain Frequency Constant                             [facility] Adult PT Treatment/Exercise - 11/19/20 0001      Ambulation/Gait   Gait Comments Antalgic gait pattern , R foot> L      Exercises   Exercises Ankle      Modalities   Modalities Iontophoresis       Iontophoresis   Type of Iontophoresis Dexamethasone    Location R anterior ankle    Dose 4 mg/ml; 1 ml    Time 6 hrs      Ankle Exercises: Stretches   Gastroc Stretch 3 reps;20 seconds      Ankle Exercises: Seated   Towel Crunch --   R toes; 15x2   Other Seated Ankle Exercises R seated "arch lift" with heel and 1st MTP floor contact 15x2 reps bilat.    Other Seated Ankle Exercises PF, INV, EV strengthening exs; 10x2                  PT Education - 11/19/20 1358    Education Details Updated HEP    Person(s) Educated Patient    Methods Explanation;Demonstration;Tactile cues;Verbal cues;Handout    Comprehension Verbalized understanding;Returned demonstration;Verbal cues required;Tactile cues required;Need further instruction               PT Long Term Goals - 10/16/20 1235      PT LONG TERM  GOAL #1   Title Independent with HEP    Baseline instructed at eval    Time 6    Period Weeks    Status New    Target Date 11/27/20      PT LONG TERM GOAL #2   Title Improve FOTO outcome measure score to 67% or greater functional status    Baseline 41%    Time 6    Period Weeks    Status New    Target Date 11/27/20      PT LONG TERM GOAL #3   Title Increase right ankle DF AROM at least 5 deg or greater to improve toe clearance for gait and for decreased gastroc tightness and improved ankle mobility to decrease pain with standing and walking    Baseline 0 deg    Time 6    Period Weeks    Status New    Target Date 11/27/20      PT LONG TERM GOAL #4   Title Tolerate standing as needed for work shifts with heel pain <3/10 at worst    Baseline 9-10/10 at worst with prolonged standing at work    Time 6    Period Weeks    Status New    Target Date 11/27/20      PT LONG TERM GOAL #5   Title Increase bilateral ankle strength to grossly 5/5 to improve ankle stability for outdoor ambulation over uneven surfaces    Baseline see objective    Time 6    Period Weeks     Status New    Target Date 11/27/20                 Plan - 11/19/20 2135    Clinical Impression Statement PT was completed for R plantar foot/achilles flexibility and strengthening for the R foot and ankle. The dorsum of the R foot continues to be the most signiifcant issue regarding pain. Pt tolerated the ther ex and the HEP was updated. Iontophoresis was provided to the R ant. ankle where the pt is having the most pain. Pt is experiencing both chronic pain for which he underwent surgery in Aug. of last year, as well as acute pain from a recent injury.    Personal Factors and Comorbidities Profession    Examination-Activity Limitations Lift;Locomotion Level;Stand    Examination-Participation Restrictions Occupation;Community Activity    Stability/Clinical Decision Making Evolving/Moderate complexity    Clinical Decision Making Moderate    Rehab Potential Good    PT Frequency 2x / week    PT Duration 6 weeks    PT Treatment/Interventions ADLs/Self Care Home Management;Cryotherapy;Electrical Stimulation;Iontophoresis 4mg /ml Dexamethasone;Moist Heat;Functional mobility training;Therapeutic activities;Gait training;Therapeutic exercise;Patient/family education;Manual techniques;Neuromuscular re-education;Taping;Dry needling;Passive range of motion    PT Next Visit Plan Continue manual with ankle mobs and STM, stretches, work on foot intrinsic and ankle strengthening and stability as tolerated    PT Home Exercise Plan Access code: [email or web address]    Consulted and Agree with Plan of Care Patient           Patient will benefit from skilled therapeutic intervention in order to improve the following deficits and impairments:  Pain,Impaired flexibility,Increased fascial restricitons,Decreased strength,Decreased activity tolerance,Decreased range of motion,Difficulty walking,Hypomobility  Visit Diagnosis: Pain in right foot  Pain in left foot  Difficulty in walking, not elsewhere  classified     Problem List Patient Active Problem List   Diagnosis Date Noted  . Hemorrhoid 09/17/2016  . Cluster headaches 10/13/2013    10/15/2013  Francyne Arreaga MS, PT 11/19/20 10:10 PM  [facility] Outpatient Rehabilitation [facility] [address] [place], [place], [address] Phone: [phone]   Fax:  [phone]  Name: Terelle Dobler MRN: [id number] Date of Birth: 1981/06/27

## 2020-11-20 ENCOUNTER — Encounter: Payer: Self-pay | Admitting: Podiatry

## 2020-11-20 ENCOUNTER — Telehealth: Payer: Self-pay | Admitting: Podiatry

## 2020-11-20 NOTE — Telephone Encounter (Signed)
Good Afternoon  Workmans Comp for Michael Ramirez needs his 11/14/20 office notes. Thank you

## 2020-11-20 NOTE — Progress Notes (Signed)
Subjective: 40 year old male presents the office today for concerns of injury that happened at work on November 04, 2020 while getting on the elevator.  He tripped and injured his surgical foot.  Since then he has had discomfort is actually seen in the emergency department for this as well.  He points more in the ankle joint where he has majority discomfort.  He has difficulty putting weight and doing prolonged walking given the discomfort and due to this he is not currently working.  X-rays previously were obtained in the emergency department which were negative.  He also has a x-ray of his lumbar spine.  He has noticed some swelling.  Denies any systemic complaints such as fevers, chills, nausea, vomiting. No acute changes since last appointment, and no other complaints at this time.   Objective: AAO x3, NAD DP/PT pulses palpable bilaterally, CRT less than 3 seconds Majority tenderness appears to be the lateral aspect of the foot on the sinus tarsi but also the anterior lateral aspect ankle joint there is localized edema there is no erythema or warmth.  Ankle, subtalar joint range of motion intact with mild restriction of subtalar joint.  No crepitation with range of motion.  MMT 5/5.  No pain with calf compression, swelling, warmth, erythema  Assessment: Capsulitis right foot secondary to injury  Plan: -All treatment options discussed with the patient including all alternatives, risks, complications.  -X-rays of the foot did not reveal any evidence of acute fracture today or stress fracture. -Previous x-rays were reviewed independently no evidence of acute fracture. -Steroid injection was performed today.  See procedure note below. -Continuing cam boot. -Patient's been out of work since November 04, 2020.  His next visit with me is scheduled for Dec 12, 2020 and placement to stay in the boot until then.  If symptoms persist will get a new MRI. -Patient encouraged to call the office with any questions,  concerns, change in symptoms.   Procedure: Injection intermediate joint Discussed alternatives, risks, complications and verbal consent was obtained.  Location: Right subtalar joint Skin Prep: Betadine. Injectate: 0.5cc 0.5% marcaine plain, 0.5 cc 2% lidocaine plain and, 1 cc kenalog 10. Disposition: Patient tolerated procedure well. Injection site dressed with a band-aid.  Post-injection care was discussed and return precautions discussed.   Vivi Barrack DPM

## 2020-11-20 NOTE — Telephone Encounter (Signed)
Note is complete

## 2020-11-26 ENCOUNTER — Ambulatory Visit: Payer: BC Managed Care – PPO | Attending: Podiatry

## 2020-11-26 ENCOUNTER — Other Ambulatory Visit: Payer: Self-pay

## 2020-11-26 DIAGNOSIS — M79671 Pain in right foot: Secondary | ICD-10-CM | POA: Diagnosis not present

## 2020-11-26 DIAGNOSIS — R262 Difficulty in walking, not elsewhere classified: Secondary | ICD-10-CM | POA: Diagnosis not present

## 2020-11-26 DIAGNOSIS — M79672 Pain in left foot: Secondary | ICD-10-CM | POA: Insufficient documentation

## 2020-11-26 NOTE — Therapy (Signed)
[facility] Outpatient Rehabilitation [facility] [address] [place], [place], [address] Phone: [phone]   Fax:  [phone]  Physical Therapy Treatment  Patient Details  Name: Michael Ramirez MRN: [id number] Date of Birth: [birth year] Referring Provider (PT): Ovid Curd, DPM   Encounter Date: 11/26/2020   PT End of Session - 11/26/20 1140    Visit Number 5    Number of Visits 12    Date for PT Re-Evaluation 11/27/20    Authorization Type BCBS, recheck FOTO visit 6    PT Start Time 1134    PT Stop Time 1218    PT Time Calculation (min) 44 min    Activity Tolerance Patient tolerated treatment well    Behavior During Therapy [facility] for tasks assessed/performed           Past Medical History:  Diagnosis Date  . Cluster headache     Past Surgical History:  Procedure Laterality Date  . 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   Subjective Assessment - 11/26/20 1141    Subjective Pt reports the ionto patch was helpful with decreasing the pain for 2 days. Pt notes a stretching/tearing of the lateral heal with push off and of the ant. ankle in mid stance.    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 Yes    Pain Score 5     Pain Location Heel    Pain Orientation Right    Pain Descriptors / Indicators Aching    Pain Type Acute pain    Pain Onset More than a month ago    Pain Frequency Constant    Aggravating Factors  prolonged standing and walking    Pain Relieving Factors Rest    Effect of Pain on Daily Activities Increased difficulty walking and tolerating prolonged standing    Pain Score 6    Pain Location Ankle    Pain Orientation Right    Pain Descriptors / Indicators Aching;Sharp    Pain Type Acute pain    Pain Onset 1 to 4  weeks ago    Pain Frequency Constant    Aggravating Factors  Prolonged standing    Pain Relieving Factors Rest                             [facility] Adult PT Treatment/Exercise - 11/26/20 0001      Exercises   Exercises Ankle      Modalities   Modalities Iontophoresis      Iontophoresis   Type of Iontophoresis Dexamethasone    Location R anterior ankle    Dose 4 mg/ml; 1 ml    Time 6 hrs      Ankle Exercises: Stretches   Soleus Stretch 1 rep;30 seconds    Gastroc Stretch 1 rep;30 seconds    Other Stretch Roller massage completed by pt.    Other Stretch Pt completed self manual stetching into ankle/foot/toe DFand PF 3 resp each x 30 sec      Ankle Exercises: Aerobic   Stationary Bike 6 min; level 3      Ankle Exercises: Seated   Towel Crunch --   R toes; 20x2   Towel Inversion/Eversion --   20x2; ball between heels  Other Seated Ankle Exercises R seated "arch lift" with heel and 1st MTP floor contact 20x2 reps bilat.    Other Seated Ankle Exercises PF, INV, EV strengthening exs; 10x2                       PT Long Term Goals - 10/16/20 1235      PT LONG TERM GOAL #1   Title Independent with HEP    Baseline instructed at eval    Time 6    Period Weeks    Status New    Target Date 11/27/20      PT LONG TERM GOAL #2   Title Improve FOTO outcome measure score to 67% or greater functional status    Baseline 41%    Time 6    Period Weeks    Status New    Target Date 11/27/20      PT LONG TERM GOAL #3   Title Increase right ankle DF AROM at least 5 deg or greater to improve toe clearance for gait and for decreased gastroc tightness and improved ankle mobility to decrease pain with standing and walking    Baseline 0 deg    Time 6    Period Weeks    Status New    Target Date 11/27/20      PT LONG TERM GOAL #4   Title Tolerate standing as needed for work shifts with heel pain <3/10 at worst    Baseline 9-10/10 at worst with  prolonged standing at work    Time 6    Period Weeks    Status New    Target Date 11/27/20      PT LONG TERM GOAL #5   Title Increase bilateral ankle strength to grossly 5/5 to improve ankle stability for outdoor ambulation over uneven surfaces    Baseline see objective    Time 6    Period Weeks    Status New    Target Date 11/27/20                 Plan - 11/26/20 1140    Clinical Impression Statement PT was continued for R ankle/foot flexibility and strenthening. In comparison to the L, the R arch is decreased and AROM for eversion is decreased.. Inotophoresis was applied again to the R ant. ankle to see if sustained improvement can be made with the acute pain in this area. Pt tolerated today's PT session without adverse efforts.    Personal Factors and Comorbidities Profession    Examination-Activity Limitations Lift;Locomotion Level;Stand    Examination-Participation Restrictions Occupation;Community Activity    Stability/Clinical Decision Making Evolving/Moderate complexity    Clinical Decision Making Moderate    Rehab Potential Good    PT Frequency 2x / week    PT Duration 6 weeks    PT Treatment/Interventions ADLs/Self Care Home Management;Cryotherapy;Electrical Stimulation;Iontophoresis 4mg /ml Dexamethasone;Moist Heat;Functional mobility training;Therapeutic activities;Gait training;Therapeutic exercise;Patient/family education;Manual techniques;Neuromuscular re-education;Taping;Dry needling;Passive range of motion    PT Next Visit Plan Continue manual with ankle mobs and STM, stretches, work on foot intrinsic, ankle, and hip strengthening and stability as tolerated    PT Home Exercise Plan Access code: [email or web address]    Consulted and Agree with Plan of Care Patient           Patient will benefit from skilled therapeutic intervention in order to improve the following deficits and impairments:  Pain,Impaired flexibility,Increased fascial restricitons,Decreased  strength,Decreased activity tolerance,Decreased range of motion,Difficulty walking,Hypomobility  Visit Diagnosis:  Pain in right foot  Pain in left foot  Difficulty in walking, not elsewhere classified     Problem List Patient Active Problem List   Diagnosis Date Noted  . Hemorrhoid 09/17/2016  . Cluster headaches 10/13/2013    Joellyn Rued MS, PT 11/26/20 3:46 PM  [facility] Outpatient Rehabilitation [facility] [address] [place], [place], [address] Phone: [phone]   Fax:  [phone]  Name: Nikolus Marczak MRN: [id number] Date of Birth: 13-Jun-1981

## 2020-12-02 ENCOUNTER — Other Ambulatory Visit: Payer: Self-pay | Admitting: Podiatry

## 2020-12-02 ENCOUNTER — Telehealth: Payer: Self-pay | Admitting: *Deleted

## 2020-12-02 DIAGNOSIS — M778 Other enthesopathies, not elsewhere classified: Secondary | ICD-10-CM

## 2020-12-02 NOTE — Telephone Encounter (Signed)
Patient is calling because started to have pain for about 20 min.after lifting his leg to get into bed. He took Tramadol, icing to relieve,afterwards toes began to twitch. Is having some concerns about this incident. Please advise.

## 2020-12-03 ENCOUNTER — Ambulatory Visit: Payer: BC Managed Care – PPO

## 2020-12-03 ENCOUNTER — Other Ambulatory Visit: Payer: Self-pay

## 2020-12-03 DIAGNOSIS — M79671 Pain in right foot: Secondary | ICD-10-CM | POA: Diagnosis not present

## 2020-12-03 DIAGNOSIS — R262 Difficulty in walking, not elsewhere classified: Secondary | ICD-10-CM

## 2020-12-03 DIAGNOSIS — M79672 Pain in left foot: Secondary | ICD-10-CM

## 2020-12-04 NOTE — Telephone Encounter (Signed)
Can you see if he is having any cramping or pain in the leg? If not I can order a muscle relaxer to see if that will help. Also, I would probably like to order an MRI.

## 2020-12-04 NOTE — Telephone Encounter (Signed)
Returned call to patient, no answer, left Vmessage of physician's recommendations.

## 2020-12-04 NOTE — Therapy (Signed)
[facility] [address] [place], [place], [address] Phone: [phone]   Fax:  [phone]  Physical Therapy Treatment  Patient Details  Name: Michael Ramirez MRN: [id number] Date of Birth: 1981/05/18 Referring Provider (PT): Ovid Curd, DPM   Encounter Date: 12/03/2020   PT End of Session - 12/03/20 1137    Visit Number 6    Number of Visits 18    Date for PT Re-Evaluation 01/25/21    Authorization Type BCBS, recheck FOTO visit 11    PT Start Time 1135    PT Stop Time 1215    PT Time Calculation (min) 40 min    Activity Tolerance Patient tolerated treatment well    Behavior During Therapy [facility] for tasks assessed/performed           Past Medical History:  Diagnosis Date  . Cluster headache     Past Surgical History:  Procedure Laterality Date  . 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   Subjective Assessment - 12/03/20 1143    Subjective Pt reports experiencing severe pain of the R anterior ankle when lifting the R LE into bed 2 nights ago. Ptthought about going to the ER, but was able to decrease the pain c a cold pack and elevation.    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 Yes    Pain Score 5     Pain Location Heel    Pain Orientation Right    Pain Descriptors / Indicators Aching    Pain Type Chronic pain    Pain Onset More than a month ago    Pain Frequency Constant    Pain Score 5    Pain Location Ankle    Pain Orientation Right;Anterior    Pain Descriptors / Indicators Aching;Sharp    Pain Type Acute pain    Pain Onset 1 to 4 weeks ago    Pain Frequency Constant              [facility] PT Assessment -  12/04/20 0001      Observation/Other Assessments   Focus on Therapeutic Outcomes (FOTO)  41%                         [facility] Adult PT Treatment/Exercise - 12/04/20 0001      Ambulation/Gait   Gait Comments Pt walks with min antalgic gait pattern today. Pt demonstrates out toeing bilat with early R heel off at end of stance and medial heel whip      Exercises   Exercises Ankle      Manual Therapy   Manual Therapy Soft tissue mobilization;Passive ROM    Soft tissue mobilization STM and cross friction massage to the plantar R foot and lateral border of the heel. Pt is most tender to the distal aspect of the calcaneous on the plantar surface and lateral border.    Passive ROM PT assisted plantar fascia stretch with ankle DF and extension of toes; 5x 30 sec      Ankle Exercises: Seated   Towel Crunch --   R toes; 20x2   Other Seated Ankle Exercises R seated "arch lift" 15x2 reps  Other Seated Ankle Exercises PF, INV, EV strengthening exs; 10x2; red Tband      Ankle Exercises: Stretches   Soleus Stretch 1 rep;30 seconds    Gastroc Stretch 1 rep;30 seconds      Ankle Exercises: Standing   Rocker Board 3 minutes   for both A/P and lateral motions                 PT Education - 12/04/20 0741    Education Details Avoid walking barefoot. To wear supportive shoes when first walking in the AM to minimize strain after resting the night. Additionally, to tocomplete AROM and gentle strtching to wrarm the r foot and ankle before walking.    Person(s) Educated Patient    Methods Explanation    Comprehension Verbalized understanding               PT Long Term Goals - 12/04/20 0746      PT LONG TERM GOAL #1   Title Independent with HEP    Status Achieved    Target Date 01/25/21      PT LONG TERM GOAL #2   Title Improve FOTO outcome measure score to 67% or greater functional status. 12/03/20: 41%    Baseline 41%    Status On-going    Target Date 01/25/21       PT LONG TERM GOAL #3   Title Increase right ankle DF AROM at least 5 deg or greater to improve toe clearance for gait and for decreased gastroc tightness and improved ankle mobility to decrease pain with standing and walking. 12/03/20: pt walks c an min antalgic gait. There is ealry heel off and a medial heel whip at end of stance related to PF/heel cord tightness    Baseline 0 deg    Status On-going      PT LONG TERM GOAL #4   Title Tolerate standing as needed for work shifts with heel pain <3/10 at worst. 12/03/20: Baseline pain is 5/10    Baseline 9-10/10 at worst with prolonged standing at work    Status On-going    Target Date 01/25/21      PT LONG TERM GOAL #5   Title Increase bilateral ankle strength to grossly 5/5 to improve ankle stability for outdoor ambulation over uneven surfaces. 12/03/20: NT    Baseline see objective    Status On-going    Target Date 01/25/21                 Plan - 12/03/20 1137    Clinical Impression Statement Pt's R foot pain and function has not made gains over the course of 6 PT visits. Pt's progress has been limited due to an acute injury to the dorsum of the R foot at work which occured in mid-April. PT has been provided to address pain, ROM and strength of the R ankle to improve functional use. Pt is ind is a HEP which he reports completing consistently. With only 6 visits completed to date, pt may benefit from continued PT 2w6 including Aquatics Therapy to promote improve tolerance for functional wt. bearing activities in preparation for return to work.    Personal Factors and Comorbidities Profession    Examination-Activity Limitations Lift;Locomotion Level;Stand    Examination-Participation Restrictions Occupation;Community Activity    Stability/Clinical Decision Making Evolving/Moderate complexity    Clinical Decision Making Moderate    Rehab Potential Good    PT Frequency 2x / week    PT Duration 6 weeks  PT Treatment/Interventions  ADLs/Self Care Home Management;Cryotherapy;Electrical Stimulation;Iontophoresis 4mg /ml Dexamethasone;Moist Heat;Functional mobility training;Therapeutic activities;Gait training;Therapeutic exercise;Patient/family education;Manual techniques;Neuromuscular re-education;Taping;Dry needling;Passive range of motion;Aquatic Therapy    PT Next Visit Plan Continue manual with ankle mobs and STM, stretches, work on foot intrinsic, ankle, and hip strengthening and stability as tolerated    PT Home Exercise Plan Access code: [email or web address]    Consulted and Agree with Plan of Care Patient           Patient will benefit from skilled therapeutic intervention in order to improve the following deficits and impairments:  Pain,Impaired flexibility,Increased fascial restricitons,Decreased strength,Decreased activity tolerance,Decreased range of motion,Difficulty walking,Hypomobility  Visit Diagnosis: Pain in right foot - Plan: PT plan of care cert/re-cert  Pain in left foot - Plan: PT plan of care cert/re-cert  Difficulty in walking, not elsewhere classified - Plan: PT plan of care cert/re-cert     Problem List Patient Active Problem List   Diagnosis Date Noted  . Hemorrhoid 09/17/2016  . Cluster headaches 10/13/2013   10/15/2013 MS, PT 12/04/20 8:20 AM  [facility] [address] [place], [place], [place] Phone: [phone]   Fax:  [phone]  Name: Michael Ramirez MRN: Alvin Critchley Date of Birth: 1980-08-07

## 2020-12-05 ENCOUNTER — Other Ambulatory Visit: Payer: Self-pay | Admitting: Podiatry

## 2020-12-05 DIAGNOSIS — R252 Cramp and spasm: Secondary | ICD-10-CM

## 2020-12-05 DIAGNOSIS — M7751 Other enthesopathy of right foot: Secondary | ICD-10-CM

## 2020-12-05 MED ORDER — CYCLOBENZAPRINE HCL 10 MG PO TABS: 10.0000 mg | ORAL_TABLET | Freq: Three times a day (TID) | ORAL | 0 refills | Status: DC | PRN

## 2020-12-05 NOTE — Telephone Encounter (Signed)
Thanks. I have sent over the flexeril and also due to the cramping I have ordered a Venous duplex to rule out DVT although I think it is unlikely.   I have also ordered a MRI of the right ankle. Misty Stanley- can you please follow up? Thanks.

## 2020-12-05 NOTE — Telephone Encounter (Signed)
Called and informed patient that muscle relaxer has been sent to pharmacy and an MRI, Vas [place] lower extremity venous has been ordered. Someone will call him to schedule.verbalized understanding.

## 2020-12-05 NOTE — Telephone Encounter (Signed)
Returned call to patient and he said that the pain he felt was like cramping in his leg as physician suggested. Instructed that doctor will send in that prescription and order MRI,verbalized understanding.

## 2020-12-06 ENCOUNTER — Telehealth: Payer: Self-pay | Admitting: Podiatry

## 2020-12-06 ENCOUNTER — Encounter: Payer: Self-pay | Admitting: Podiatry

## 2020-12-06 NOTE — Telephone Encounter (Signed)
Pt is requesting form or letter stating he needs an MRI to be sent over to workers comp. Please advise.

## 2020-12-06 NOTE — Telephone Encounter (Signed)
Can you please write the note and have it sent over? I would like to get the MRI due to the injury and continued pain given that he has a negative x-ray. Thanks.

## 2020-12-10 ENCOUNTER — Ambulatory Visit (HOSPITAL_COMMUNITY)
Admission: RE | Admit: 2020-12-10 | Discharge: 2020-12-10 | Disposition: A | Discharge status: Home / Self Care | Payer: BC Managed Care – PPO | Source: Ambulatory Visit | Attending: Podiatry | Admitting: Podiatry

## 2020-12-10 ENCOUNTER — Ambulatory Visit: Payer: BC Managed Care – PPO

## 2020-12-10 ENCOUNTER — Other Ambulatory Visit: Payer: Self-pay

## 2020-12-10 DIAGNOSIS — M79672 Pain in left foot: Secondary | ICD-10-CM

## 2020-12-10 DIAGNOSIS — R262 Difficulty in walking, not elsewhere classified: Secondary | ICD-10-CM | POA: Diagnosis not present

## 2020-12-10 DIAGNOSIS — R252 Cramp and spasm: Secondary | ICD-10-CM | POA: Insufficient documentation

## 2020-12-10 DIAGNOSIS — M79671 Pain in right foot: Secondary | ICD-10-CM

## 2020-12-10 NOTE — Therapy (Signed)
[facility] [address] [place], [place], [address] Phone: [phone]   Fax:  [phone]  Physical Therapy Treatment  Patient Details  Name: Michael Ramirez MRN: [id number] Date of Birth: Dec 22, 1980 Referring Provider (PT): Ovid Curd, DPM   Encounter Date: 12/10/2020   PT End of Session - 12/10/20 1431    Visit Number 7    Number of Visits 18    Date for PT Re-Evaluation 01/25/21    Authorization Type [employer], recheck FOTO visit 11    PT Start Time 1139    PT Stop Time 1230    PT Time Calculation (min) 51 min    Activity Tolerance Patient tolerated treatment well    Behavior During Therapy [facility] for tasks assessed/performed           Past Medical History:  Diagnosis Date  . Cluster headache     Past Surgical History:  Procedure Laterality Date  . 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   Subjective Assessment - 12/10/20 1144    Subjective Pt reports his L foot/ankle pain about the same. I'm concerned about the cramp/spasms on the top of my R lower leg and foot when L lie down. I also continue to have pulling pain on my lateral heel and on the front of my ankle when I step off my R foot. Pt is scheduled for a MRI later today.    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 No/denies   Currently   Pain Location Heel   ankle, foot   Pain Orientation Right    Pain Descriptors / Indicators Aching    Pain Type Chronic pain;Acute pain    Pain Onset More than a month ago    Pain Frequency Intermittent    Aggravating Factors  prolonged standing and walking    Pain Relieving Factors rest    Effect of Pain on Daily Activities prolonged standing and walking                              [facility] Adult PT Treatment/Exercise - 12/10/20 0001      Exercises   Exercises Ankle;Knee/Hip      Knee/Hip Exercises: Standing   Forward Lunges Right;Left;2 sets;10 reps    Forward Lunges Limitations Partial, l decreased vs. R due to decreased r ankle ROM/tightness.    Other Standing Knee Exercises Band exs c green Tband. Side steps, diagonal walks forward/backward; 20x4 each.      Modalities   Modalities Cryotherapy      Cryotherapy   Number Minutes Cryotherapy 10 Minutes    Cryotherapy Location Ankle   lateral R ankle and plantar surface of foot   Type of Cryotherapy Ice massage   frozen bottle     Manual Therapy   Manual Therapy Soft tissue mobilization;Passive ROM    Soft tissue mobilization Cross friction massage R lateral heel  the 5th metatarsal head and distal lateral ankle.      Ankle Exercises: Stretches   Soleus Stretch 1 rep;30 seconds    Gastroc Stretch 1 rep;30 seconds      Ankle Exercises: Standing   Other Standing Ankle Exercises DF/PF 20x  PT Education - 12/10/20 1430    Education Details Frozen bottle massage to the plantar sureface of the R foot    Person(s) Educated Patient    Methods Explanation;Demonstration    Comprehension Verbalized understanding;Returned demonstration               PT Long Term Goals - 12/04/20 0746      PT LONG TERM GOAL #1   Title Independent with HEP    Status Achieved    Target Date 01/25/21      PT LONG TERM GOAL #2   Title Improve FOTO outcome measure score to 67% or greater functional status. 12/03/20: 41%    Baseline 41%    Status On-going    Target Date 01/25/21      PT LONG TERM GOAL #3   Title Increase right ankle DF AROM at least 5 deg or greater to improve toe clearance for gait and for decreased gastroc tightness and improved ankle mobility to decrease pain with standing and walking. 12/03/20: pt walks c an min antalgic gait. There is ealry  heel off and a medial heel whip at end of stance related to PF/heel cord tightness    Baseline 0 deg    Status On-going      PT LONG TERM GOAL #4   Title Tolerate standing as needed for work shifts with heel pain <3/10 at worst. 12/03/20: Baseline pain is 5/10    Baseline 9-10/10 at worst with prolonged standing at work    Status On-going    Target Date 01/25/21      PT LONG TERM GOAL #5   Title Increase bilateral ankle strength to grossly 5/5 to improve ankle stability for outdoor ambulation over uneven surfaces. 12/03/20: NT    Baseline see objective    Status On-going    Target Date 01/25/21                 Plan - 12/10/20 1432    Clinical Impression Statement Cross friction massge was provided to the R lateral border of the heel and peroneal tendons to address mobility and pain. Hip strengthening exs were initiated for support and to reduce ankle strain. Partial lunges were limited by decrased ankle DF ROM, R more so than L. Pt was then instructed in and completed a frozen water bottle massage to the plantar surface of the R foot and a ice massage to the lateral ankle and border of the heel and foot for pain and swelling management    Personal Factors and Comorbidities Profession    Examination-Activity Limitations Lift;Locomotion Level;Stand    Examination-Participation Restrictions Occupation;Community Activity    Stability/Clinical Decision Making Evolving/Moderate complexity    Clinical Decision Making Moderate    Rehab Potential Good    PT Frequency 2x / week    PT Duration 6 weeks    PT Treatment/Interventions ADLs/Self Care Home Management;Cryotherapy;Electrical Stimulation;Iontophoresis 4mg /ml Dexamethasone;Moist Heat;Functional mobility training;Therapeutic activities;Gait training;Therapeutic exercise;Patient/family education;Manual techniques;Neuromuscular re-education;Taping;Dry needling;Passive range of motion;Aquatic Therapy    PT Next Visit Plan Continue manual  with ankle mobs and STM, stretches, work on foot intrinsic, ankle, and hip strengthening and stability as tolerated    PT Home Exercise Plan Access code: [email or web address]    Consulted and Agree with Plan of Care Patient           Patient will benefit from skilled therapeutic intervention in order to improve the following deficits and impairments:  Pain,Impaired flexibility,Increased fascial restricitons,Decreased strength,Decreased activity tolerance,Decreased range of motion,Difficulty  walking,Hypomobility  Visit Diagnosis: Pain in right foot  Pain in left foot  Difficulty in walking, not elsewhere classified     Problem List Patient Active Problem List   Diagnosis Date Noted  . Hemorrhoid 09/17/2016  . Cluster headaches 10/13/2013   Joellyn Rued MS, PT 12/10/20 6:16 PM   [facility] [address] [place], [place], [address] Phone: [phone]   Fax:  [phone]  Name: Michael Ramirez MRN: [id number] Date of Birth: 07/31/1980

## 2020-12-11 ENCOUNTER — Telehealth: Payer: Self-pay | Admitting: *Deleted

## 2020-12-11 NOTE — Telephone Encounter (Signed)
Called and spoke with the patient and relayed the message per Dr Wagoner. Michael Ramirez 

## 2020-12-11 NOTE — Telephone Encounter (Signed)
-----   Message from Vivi Barrack, DPM sent at 12/10/2020  5:20 PM EDT ----- Negative for any blood clot, please let him know.

## 2020-12-12 ENCOUNTER — Ambulatory Visit (INDEPENDENT_AMBULATORY_CARE_PROVIDER_SITE_OTHER): Payer: No Typology Code available for payment source | Admitting: Podiatry

## 2020-12-12 ENCOUNTER — Other Ambulatory Visit: Payer: Self-pay

## 2020-12-12 DIAGNOSIS — M722 Plantar fascial fibromatosis: Secondary | ICD-10-CM | POA: Diagnosis not present

## 2020-12-12 DIAGNOSIS — Q6689 Other  specified congenital deformities of feet: Secondary | ICD-10-CM

## 2020-12-12 DIAGNOSIS — M7751 Other enthesopathy of right foot: Secondary | ICD-10-CM | POA: Diagnosis not present

## 2020-12-12 MED ORDER — TRIAMCINOLONE ACETONIDE 10 MG/ML IJ SUSP
10.0000 mg | Freq: Once | INTRAMUSCULAR | Status: AC
  Administered 2020-12-12: 10 mg

## 2020-12-12 NOTE — Telephone Encounter (Signed)
Patient has an MRI appointment on 12-20-2020. Misty Stanley

## 2020-12-12 NOTE — Progress Notes (Signed)
Subjective: 40 year old male presents the office today for concerns of injury that happened at work on November 04, 2020 while getting on the elevator.  Prior to the injury he states that the foot was feeling much better and he is able to walk.  He states that he has not been able to work because of discomfort.  Injection at the last appointment did help for couple weeks.  He has been doing physical therapy which is been helpful but I can increase this to 2 times a week.   Objective: AAO x3, NAD DP/PT pulses palpable bilaterally, CRT less than 3 seconds Majority tenderness appears to be the lateral aspect of the foot on the sinus tarsi but also today is having discomfort to the plantar lateral aspect the calcaneus and insertion of plantar fascia.  This runs along the lateral aspect of the plantar foot.  There is no area of pinpoint tenderness.  Decreased ankle joint range of motion on the right side compared to the left.  No other areas of discomfort identified.  MMT 5/5.  MMT 5/5.  No pain with calf compression, swelling, warmth, erythema  Assessment: Capsulitis right foot secondary to injury; plan fasciitis  Plan: -All treatment options discussed with the patient including all alternatives, risks, complications.  -Today steroid injection was performed to the plantar lateral aspect the calcaneus and insertion of plantar fascia.  Skin was prepped with alcohol and mixture of 1 cc Kenalog 10, 1 cc of lidocaine plain was infiltrated in the area of tenderness complications.  Postinjection care discussed. -Continue physical therapy. -Continue with brace as needed.  Ice daily.  He has been taking tramadol at nighttime for pain we discussed using a muscle relaxer.  He was describing some cramping to his toes particularly at the end of the day at night it is due to compensation. -Discussed decreased range of motion subtalar joint.  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 -MRI is pending -I will see him back in 4 weeks or sooner if any issues are to arise.  We will keep him out of work for 4 more weeks and once he starts to feel better he can return to work prior otherwise I will see him in 4 weeks  Vivi Barrack DPM

## 2020-12-13 ENCOUNTER — Encounter: Payer: Self-pay | Admitting: Podiatry

## 2020-12-19 ENCOUNTER — Ambulatory Visit: Payer: BC Managed Care – PPO

## 2020-12-20 ENCOUNTER — Other Ambulatory Visit: Payer: Self-pay

## 2020-12-20 ENCOUNTER — Ambulatory Visit
Admission: RE | Admit: 2020-12-20 | Discharge: 2020-12-20 | Disposition: A | Discharge status: Home / Self Care | Payer: Worker's Compensation | Source: Ambulatory Visit | Attending: Podiatry | Admitting: Podiatry

## 2020-12-20 DIAGNOSIS — M7751 Other enthesopathy of right foot: Secondary | ICD-10-CM

## 2020-12-25 ENCOUNTER — Ambulatory Visit: Payer: BC Managed Care – PPO | Attending: Podiatry

## 2020-12-25 ENCOUNTER — Other Ambulatory Visit: Payer: Self-pay

## 2020-12-25 DIAGNOSIS — M6281 Muscle weakness (generalized): Secondary | ICD-10-CM | POA: Insufficient documentation

## 2020-12-25 DIAGNOSIS — M79671 Pain in right foot: Secondary | ICD-10-CM | POA: Diagnosis not present

## 2020-12-25 DIAGNOSIS — M79672 Pain in left foot: Secondary | ICD-10-CM | POA: Diagnosis not present

## 2020-12-25 DIAGNOSIS — R262 Difficulty in walking, not elsewhere classified: Secondary | ICD-10-CM | POA: Diagnosis not present

## 2020-12-25 DIAGNOSIS — R2689 Other abnormalities of gait and mobility: Secondary | ICD-10-CM | POA: Diagnosis not present

## 2020-12-25 NOTE — Therapy (Signed)
[facility] Outpatient Rehabilitation [facility] [address] [place], [place], [address] Phone: [phone]   Fax:  [phone]  Physical Therapy Treatment  Patient Details  Name: Michael Ramirez MRN: [id number] Date of Birth: 1980/08/26 Referring Provider (PT): Ovid Curd, DPM   Encounter Date: 12/25/2020   PT End of Session - 12/25/20 1035    Visit Number 8    Number of Visits 18    Date for PT Re-Evaluation 01/25/21    Authorization Type BCBS, recheck FOTO visit 11    PT Start Time 0939    PT Stop Time 1030    PT Time Calculation (min) 51 min    Activity Tolerance Patient tolerated treatment well    Behavior During Therapy [facility] for tasks assessed/performed           Past Medical History:  Diagnosis Date  . Cluster headache     Past Surgical History:  Procedure Laterality Date  . 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   Subjective Assessment - 12/25/20 1009    Subjective Pt reports his R anle/foot pain is a little better. The anterior foot pain seems to be getting better. 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  2.  No acute osseous injury of the right ankle. Past MRI and X-rays last Summer.    Patient Stated Goals Resolve heel pain, work without pain    Currently in Pain? Yes    Pain Score 4     Pain Location Heel    Pain Orientation Right    Pain Descriptors / Indicators Aching    Pain Type Chronic pain    Pain Onset More than a month ago    Pain Frequency Intermittent    Pain Score 4    Pain Location Ankle    Pain Orientation Right    Pain Descriptors / Indicators Aching;Sharp    Pain  Onset 1 to 4 weeks ago    Pain Frequency Constant                             [facility] Adult PT Treatment/Exercise - 12/25/20 0001      Exercises   Exercises Ankle;Knee/Hip      Knee/Hip Exercises: Standing   Lateral Step Up Right;2 sets;20 reps    Forward Step Up Right;2 sets;20 reps;Hand Hold: 1    Forward Step Up Limitations High L knee      Ankle Exercises: Stretches   Soleus Stretch 2 reps;20 seconds    Gastroc Stretch 2 reps;20 seconds    Slant Board Stretch 2 reps;20 seconds      Ankle Exercises: Seated   BAPS Sitting;Level 1   10x2 CW and CCW     Ankle Exercises: Supine   T-Band 4 way; 20x; Elizabeth Palau                       PT Long Term Goals - 12/04/20 0746      PT LONG TERM GOAL #1   Title Independent with HEP    Status Achieved    Target Date 01/25/21      PT LONG  TERM GOAL #2   Title Improve FOTO outcome measure score to 67% or greater functional status. 12/03/20: 41%    Baseline 41%    Status On-going    Target Date 01/25/21      PT LONG TERM GOAL #3   Title Increase right ankle DF AROM at least 5 deg or greater to improve toe clearance for gait and for decreased gastroc tightness and improved ankle mobility to decrease pain with standing and walking. 12/03/20: pt walks c an min antalgic gait. There is ealry heel off and a medial heel whip at end of stance related to PF/heel cord tightness    Baseline 0 deg    Status On-going      PT LONG TERM GOAL #4   Title Tolerate standing as needed for work shifts with heel pain <3/10 at worst. 12/03/20: Baseline pain is 5/10    Baseline 9-10/10 at worst with prolonged standing at work    Status On-going    Target Date 01/25/21      PT LONG TERM GOAL #5   Title Increase bilateral ankle strength to grossly 5/5 to improve ankle stability for outdoor ambulation over uneven surfaces. 12/03/20: NT    Baseline see objective    Status On-going    Target Date 01/25/21                  Plan - 12/25/20 1036    Clinical Impression Statement Pt reports having a new MRI on 12/21/20 and he is planning to call Dr. Ardelle Anton for the results. He also has a scheduled appt. with Dr. Ardelle Anton on 6/16. Pts gait pattern continues with R foot early heel off with medial whip press off at end of stance. PT was provided for therex for R ankle ROM and strengthening. Strengthening was provided per Tband and [facility] exs for single leg stability challenge. Pt tolerated without adverse effects. Pt is going home after PT and reports he will use a cold pack as needed.    Personal Factors and Comorbidities Profession    Examination-Activity Limitations Lift;Locomotion Level;Stand    Examination-Participation Restrictions Occupation;Community Activity    Stability/Clinical Decision Making Evolving/Moderate complexity    Clinical Decision Making Moderate    Rehab Potential Good    PT Frequency 2x / week    PT Duration 6 weeks    PT Treatment/Interventions ADLs/Self Care Home Management;Cryotherapy;Electrical Stimulation;Iontophoresis 4mg /ml Dexamethasone;Moist Heat;Functional mobility training;Therapeutic activities;Gait training;Therapeutic exercise;Patient/family education;Manual techniques;Neuromuscular re-education;Taping;Dry needling;Passive range of motion;Aquatic Therapy    PT Next Visit Plan Work on foot intrinsic, ankle, and hip strengthening and stability as tolerated. Continue manual with ankle mobs and STM, stretches>    PT Home Exercise Plan Access code: [email or web address]    Consulted and Agree with Plan of Care Patient           Patient will benefit from skilled therapeutic intervention in order to improve the following deficits and impairments:  Pain,Impaired flexibility,Increased fascial restricitons,Decreased strength,Decreased activity tolerance,Decreased range of motion,Difficulty walking,Hypomobility  Visit Diagnosis: Pain in right foot  Difficulty in walking, not elsewhere classified  Muscle  weakness (generalized)  Other abnormalities of gait and mobility     Problem List Patient Active Problem List   Diagnosis Date Noted  . Hemorrhoid 09/17/2016  . Cluster headaches 10/13/2013    10/15/2013 MS, PT 12/25/20 11:00 AM  [facility] [address] [place], [place], [place] Phone: [phone]   Fax:  [phone]  Name: Michael Ramirez MRN:  [id number] Date of Birth: 1980-09-10

## 2020-12-27 ENCOUNTER — Ambulatory Visit: Payer: BC Managed Care – PPO

## 2020-12-27 ENCOUNTER — Other Ambulatory Visit: Payer: Self-pay

## 2020-12-27 DIAGNOSIS — R262 Difficulty in walking, not elsewhere classified: Secondary | ICD-10-CM

## 2020-12-27 DIAGNOSIS — M79672 Pain in left foot: Secondary | ICD-10-CM

## 2020-12-27 DIAGNOSIS — M6281 Muscle weakness (generalized): Secondary | ICD-10-CM | POA: Diagnosis not present

## 2020-12-27 DIAGNOSIS — R2689 Other abnormalities of gait and mobility: Secondary | ICD-10-CM

## 2020-12-27 DIAGNOSIS — M79671 Pain in right foot: Secondary | ICD-10-CM | POA: Diagnosis not present

## 2020-12-27 NOTE — Patient Instructions (Signed)
  [email or web address] 

## 2020-12-27 NOTE — Therapy (Signed)
[facility] Outpatient Rehabilitation [facility] [address] [place], [place], [address] Phone: [phone]   Fax:  [phone]  Physical Therapy Treatment  Patient Details  Name: Michael Ramirez MRN: [id number] Date of Birth: [birth year] Referring Provider (PT): Ovid Curd, DPM   Encounter Date: 12/27/2020   PT End of Session - 12/27/20 1306    Visit Number 9    Number of Visits 18    Date for PT Re-Evaluation 01/25/21    Authorization Type BCBS, recheck FOTO visit 11    PT Start Time 1055    PT Stop Time 1135    PT Time Calculation (min) 40 min    Activity Tolerance Patient tolerated treatment well    Behavior During Therapy [facility] for tasks assessed/performed           Past Medical History:  Diagnosis Date  . Cluster headache     Past Surgical History:  Procedure Laterality Date  . 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   Subjective Assessment - 12/27/20 1053    Subjective Pt reports his ankle pain is usually worst when laying down at night. He reports not being in much pain today and reports that ice can help decrease his pain. He reports being adherent to his HEP, reporting soreness after his exercises lasting about 3-4 hours.    Pertinent History 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    Limitations Standing;Walking    Diagnostic tests 12/21/20: IMPRESSION:  1. 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  2.  No acute osseous injury of the right ankle. Past MRI and X-rays last Summer.    Currently in Pain? Yes    Pain Score 4     Pain Location Heel    Pain Orientation Right    Pain Descriptors / Indicators Aching    Pain Type Chronic pain    Pain Onset More than a month ago    Pain Frequency Constant    Pain Score 4    Pain Location Ankle    Pain  Orientation Right    Pain Descriptors / Indicators Aching    Pain Type Chronic pain    Pain Onset More than a month ago    Pain Frequency Constant              [facility] PT Assessment - 12/27/20 0001      Observation/Other Assessments   Observations Talocrural AP/PA painful and hypomobile BIL                         [facility] Adult PT Treatment/Exercise - 12/27/20 0001      Cryotherapy   Number Minutes Cryotherapy 10 Minutes    Cryotherapy Location Ankle    Type of Cryotherapy Ice pack      Manual Therapy   Joint Mobilization Grade 4 talocrural AP joint mobilization    Soft tissue mobilization STM to anterior tibialis and peroneals on R      Ankle Exercises: Standing   Other Standing Ankle Exercises SL heel raise on Airex pad with band pulling into calcaneal inversion 2x10 on R    Other Standing Ankle Exercises SL heel raise on Airex pad in parallel bars with GTB pulling into calcaneal inversion 2x10 on R  PT Education - 12/27/20 1305    Education Details Pt instructed on STM to anterior tibialis muscle, as well as proper form when performing heel raises with a theraband.    Person(s) Educated Patient    Methods Explanation;Demonstration;Handout;Verbal cues    Comprehension Verbal cues required;Returned demonstration;Verbalized understanding               PT Long Term Goals - 12/04/20 0746      PT LONG TERM GOAL #1   Title Independent with HEP    Status Achieved    Target Date 01/25/21      PT LONG TERM GOAL #2   Title Improve FOTO outcome measure score to 67% or greater functional status. 12/03/20: 41%    Baseline 41%    Status On-going    Target Date 01/25/21      PT LONG TERM GOAL #3   Title Increase right ankle DF AROM at least 5 deg or greater to improve toe clearance for gait and for decreased gastroc tightness and improved ankle mobility to decrease pain with standing and walking. 12/03/20: pt walks c an min antalgic  gait. There is ealry heel off and a medial heel whip at end of stance related to PF/heel cord tightness    Baseline 0 deg    Status On-going      PT LONG TERM GOAL #4   Title Tolerate standing as needed for work shifts with heel pain <3/10 at worst. 12/03/20: Baseline pain is 5/10    Baseline 9-10/10 at worst with prolonged standing at work    Status On-going    Target Date 01/25/21      PT LONG TERM GOAL #5   Title Increase bilateral ankle strength to grossly 5/5 to improve ankle stability for outdoor ambulation over uneven surfaces. 12/03/20: NT    Baseline see objective    Status On-going    Target Date 01/25/21                 Plan - 12/27/20 1309    Clinical Impression Statement Pt arrived to his appointment 8 minutes late, which caused the session to be trucated. He demonstrates decreased talocrural joint mobility in AP and PA on the R, as well as tenderness to his anterior tibialis and peroneal musculature on the R. The pt demonstrated a positive response when performing peroneal and anterior tibialis strengthening exercises, stating that he could feel them working. He responded well to [facility] and cryotherapy, reporting a therapeutic response to each of these interventions. He will continue to benefit from skilled PT to address his primary impairments and help him return to his prior level of function without limitation.    Personal Factors and Comorbidities Profession    Examination-Activity Limitations Lift;Locomotion Level;Stand    Examination-Participation Restrictions Occupation;Community Activity    Stability/Clinical Decision Making Evolving/Moderate complexity    Clinical Decision Making Moderate    Rehab Potential Good    PT Frequency 2x / week    PT Duration 6 weeks    PT Treatment/Interventions ADLs/Self Care Home Management;Cryotherapy;Electrical Stimulation;Iontophoresis 4mg /ml Dexamethasone;Moist Heat;Functional mobility training;Therapeutic activities;Gait  training;Therapeutic exercise;Patient/family education;Manual techniques;Neuromuscular re-education;Taping;Dry needling;Passive range of motion;Aquatic Therapy    PT Next Visit Plan Work on foot intrinsic, ankle, and hip strengthening with an added focus on anterior tibialis and peroneal musculature. Continue manual with ankle mobs and STM. Consider dynamic balance/ plyometrics to pt's tolerance.    PT Home Exercise Plan Access code: [email or web address]    Consulted and Agree with  Plan of Care Patient           Patient will benefit from skilled therapeutic intervention in order to improve the following deficits and impairments:  Pain,Impaired flexibility,Increased fascial restricitons,Decreased strength,Decreased activity tolerance,Decreased range of motion,Difficulty walking,Hypomobility  Visit Diagnosis: Pain in right foot  Difficulty in walking, not elsewhere classified  Muscle weakness (generalized)  Other abnormalities of gait and mobility  Pain in left foot     Problem List Patient Active Problem List   Diagnosis Date Noted  . Hemorrhoid 09/17/2016  . Cluster headaches 10/13/2013    Carmelina Dane, PT, DPT 12/27/20 1:14 PM   [facility] Outpatient Rehabilitation [facility] [address] [place], [place], [address] Phone: [phone]   Fax:  [phone]  Name: Charley Miske MRN: [id number] Date of Birth: 07-01-1981

## 2020-12-31 ENCOUNTER — Other Ambulatory Visit: Payer: Self-pay

## 2020-12-31 ENCOUNTER — Other Ambulatory Visit: Payer: Self-pay | Admitting: Podiatry

## 2020-12-31 ENCOUNTER — Ambulatory Visit: Payer: BC Managed Care – PPO

## 2020-12-31 ENCOUNTER — Telehealth: Payer: Self-pay | Admitting: *Deleted

## 2020-12-31 DIAGNOSIS — M6281 Muscle weakness (generalized): Secondary | ICD-10-CM

## 2020-12-31 DIAGNOSIS — M79672 Pain in left foot: Secondary | ICD-10-CM | POA: Diagnosis not present

## 2020-12-31 DIAGNOSIS — R262 Difficulty in walking, not elsewhere classified: Secondary | ICD-10-CM

## 2020-12-31 DIAGNOSIS — M79671 Pain in right foot: Secondary | ICD-10-CM

## 2020-12-31 DIAGNOSIS — R2689 Other abnormalities of gait and mobility: Secondary | ICD-10-CM | POA: Diagnosis not present

## 2020-12-31 MED ORDER — TRAMADOL HCL 50 MG PO TABS: 50.0000 mg | ORAL_TABLET | Freq: Two times a day (BID) | ORAL | 0 refills | Status: AC | PRN

## 2020-12-31 MED ORDER — CYCLOBENZAPRINE HCL 10 MG PO TABS: 10.0000 mg | ORAL_TABLET | Freq: Three times a day (TID) | ORAL | 0 refills | Status: DC | PRN

## 2020-12-31 NOTE — Patient Instructions (Signed)
  [email or web address]

## 2020-12-31 NOTE — Progress Notes (Signed)
Refilled medications. He only takes tramadol a few times a week as needed. He gets a spasm and takes the flexeril as needed. He is still doing PT as well. Reviewed the MRI with him. For now continue PT and current treatment. He has a follow up next week and we can further discuss. He had no further questions.

## 2020-12-31 NOTE — Telephone Encounter (Signed)
Patient is requesting a refill of his 2 medications and would like the results from MRI. Please advise.

## 2020-12-31 NOTE — Therapy (Signed)
[facility] Outpatient Rehabilitation [facility] [address] [place], [place], [address] Phone: [phone]   Fax:  [phone]  Physical Therapy Treatment  Patient Details  Name: Michael Ramirez MRN: [id number] Date of Birth: 1981/06/10 Referring Provider (PT): Ovid Curd, DPM   Encounter Date: 12/31/2020   PT End of Session - 12/31/20 1303    Visit Number 10    Number of Visits 18    Date for PT Re-Evaluation 01/25/21    Authorization Type BCBS, recheck FOTO visit 11    PT Start Time 1215    PT Stop Time 1300    PT Time Calculation (min) 45 min    Activity Tolerance Patient tolerated treatment well    Behavior During Therapy [facility] for tasks assessed/performed           Past Medical History:  Diagnosis Date  . Cluster headache     Past Surgical History:  Procedure Laterality Date  . 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   Subjective Assessment - 12/31/20 1217    Subjective Pt reports his ankle feels "a little better today compared to presviously." However, her reports it hurt very badly yesterday after being on his feet for about 40 minutes. He reports being adherent to his HEP, performing his exercises every day.    Pertinent History 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    Limitations Standing;Walking    Diagnostic tests 12/21/20: IMPRESSION:  1. 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  2.  No acute osseous injury of the right ankle. Past MRI and X-rays last Summer.    Patient Stated Goals Resolve heel pain, work without pain    Currently in Pain? Yes    Pain Score 4     Pain Location Heel    Pain Orientation Right;Lateral    Pain Descriptors / Indicators Aching    Pain Type Chronic pain    Pain Onset More than a month ago    Pain Frequency  Constant    Pain Location Ankle    Pain Orientation Right;Lateral    Pain Descriptors / Indicators Aching    Pain Type Chronic pain    Pain Onset More than a month ago    Pain Frequency Constant                             [facility] Adult PT Treatment/Exercise - 12/31/20 0001      Knee/Hip Exercises: Standing   Other Standing Knee Exercises SL mini squat on Bosu ball with R foot on R side of ball in // bars 2x10    Other Standing Knee Exercises Marching on Bosu ball in // bars 3x30sec      Cryotherapy   Number Minutes Cryotherapy 10 Minutes    Cryotherapy Location Ankle    Type of Cryotherapy Ice pack      Manual Therapy   Soft tissue mobilization STM to anterior tibialis and peroneals on R                  PT Education - 12/31/20 1302    Education Details Pt educated on physiology behind muscle "knots" in his peroneal musculature. Also educated on exercises targeting peroneal musculature, in particular SL mini-squats with his R foot positioned on the R side of the Bosu  ball.    Person(s) Educated Patient    Methods Explanation;Demonstration;Verbal cues;Handout    Comprehension Verbal cues required;Returned demonstration;Verbalized understanding               PT Long Term Goals - 12/04/20 0746      PT LONG TERM GOAL #1   Title Independent with HEP    Status Achieved    Target Date 01/25/21      PT LONG TERM GOAL #2   Title Improve FOTO outcome measure score to 67% or greater functional status. 12/03/20: 41%    Baseline 41%    Status On-going    Target Date 01/25/21      PT LONG TERM GOAL #3   Title Increase right ankle DF AROM at least 5 deg or greater to improve toe clearance for gait and for decreased gastroc tightness and improved ankle mobility to decrease pain with standing and walking. 12/03/20: pt walks c an min antalgic gait. There is ealry heel off and a medial heel whip at end of stance related to PF/heel cord tightness    Baseline 0  deg    Status On-going      PT LONG TERM GOAL #4   Title Tolerate standing as needed for work shifts with heel pain <3/10 at worst. 12/03/20: Baseline pain is 5/10    Baseline 9-10/10 at worst with prolonged standing at work    Status On-going    Target Date 01/25/21      PT LONG TERM GOAL #5   Title Increase bilateral ankle strength to grossly 5/5 to improve ankle stability for outdoor ambulation over uneven surfaces. 12/03/20: NT    Baseline see objective    Status On-going    Target Date 01/25/21                 Plan - 12/31/20 1304    Clinical Impression Statement Pt continues to report relatively low levels of pain at rest, although this becomes exacerbated with prolonged standing. Upon assessment, pt demonstrated ability to perform SL mini squats in the parallel bars. He was then instructed on peroneal-centric mini squats on a Bosu ball, which he reports he can feel utilizing the intended musculature. The pt was asked if he would like to trial dry needling for the various trigger points identified in his peroneus longus muscle belly, to which he agreed to trial at his next visit. He will benefit from continued skilled PT to address his primary impairments and help him return to his prior level of function without limitation.    Personal Factors and Comorbidities Profession    Examination-Activity Limitations Lift;Locomotion Level;Stand    Examination-Participation Restrictions Occupation;Community Activity    Stability/Clinical Decision Making Evolving/Moderate complexity    Clinical Decision Making Moderate    Rehab Potential Good    PT Frequency 2x / week    PT Duration 6 weeks    PT Treatment/Interventions ADLs/Self Care Home Management;Cryotherapy;Electrical Stimulation;Iontophoresis 4mg /ml Dexamethasone;Moist Heat;Functional mobility training;Therapeutic activities;Gait training;Therapeutic exercise;Patient/family education;Manual techniques;Neuromuscular  re-education;Taping;Dry needling;Passive range of motion;Aquatic Therapy    PT Next Visit Plan Trial dry needling to R peroneal musculature. Work on foot intrinsic, ankle, and hip strengthening with an added focus on anterior tibialis and peroneal musculature. Continue manual with ankle mobs and STM.    PT Home Exercise Plan Access code: [email or web address]    Consulted and Agree with Plan of Care Patient           Patient will benefit from skilled therapeutic intervention  in order to improve the following deficits and impairments:  Pain,Impaired flexibility,Increased fascial restricitons,Decreased strength,Decreased activity tolerance,Decreased range of motion,Difficulty walking,Hypomobility  Visit Diagnosis: Pain in right foot  Difficulty in walking, not elsewhere classified  Muscle weakness (generalized)  Other abnormalities of gait and mobility  Pain in left foot     Problem List Patient Active Problem List   Diagnosis Date Noted  . Hemorrhoid 09/17/2016  . Cluster headaches 10/13/2013   Carmelina Dane, PT, DPT 12/31/20 1:11 PM   [facility] Outpatient Rehabilitation [facility] [address] [place], [place], [address] Phone: [phone]   Fax:  [phone]  Name: Michael Ramirez MRN: [id number] Date of Birth: Nov 03, 1980

## 2020-12-31 NOTE — Telephone Encounter (Signed)
Refilled medications. He only takes tramadol a few times a week as needed. He gets a spasm and takes the flexeril as needed. He is still doing PT as well. Reviewed the MRI with him. For now continue PT and current treatment. He has a follow up next week and we can further discuss. He had no further questions.  

## 2021-01-02 ENCOUNTER — Ambulatory Visit: Payer: BC Managed Care – PPO

## 2021-01-02 NOTE — Telephone Encounter (Signed)
Paper work was never rec'd.

## 2021-01-07 ENCOUNTER — Other Ambulatory Visit: Payer: Self-pay

## 2021-01-07 ENCOUNTER — Ambulatory Visit: Payer: BC Managed Care – PPO

## 2021-01-07 DIAGNOSIS — M6281 Muscle weakness (generalized): Secondary | ICD-10-CM | POA: Diagnosis not present

## 2021-01-07 DIAGNOSIS — R262 Difficulty in walking, not elsewhere classified: Secondary | ICD-10-CM | POA: Diagnosis not present

## 2021-01-07 DIAGNOSIS — M79672 Pain in left foot: Secondary | ICD-10-CM | POA: Diagnosis not present

## 2021-01-07 DIAGNOSIS — R2689 Other abnormalities of gait and mobility: Secondary | ICD-10-CM | POA: Diagnosis not present

## 2021-01-07 DIAGNOSIS — M79671 Pain in right foot: Secondary | ICD-10-CM | POA: Diagnosis not present

## 2021-01-08 NOTE — Therapy (Signed)
[facility] [address] [place], [place], [address] Phone: [phone]   Fax:  [phone]  Physical Therapy Treatment  Patient Details  Name: Michael Ramirez MRN: [id number] Date of Birth: 09/20/1980 Referring Provider (PT): Ovid Curd, DPM   Encounter Date: 01/07/2021   PT End of Session - 01/07/21 0850     Visit Number 11    Number of Visits 18    Date for PT Re-Evaluation 01/25/21    Authorization Type BCBS, recheck FOTO visit 11    PT Start Time 0849    PT Stop Time 0930    PT Time Calculation (min) 41 min    Activity Tolerance Patient tolerated treatment well    Behavior During Therapy [facility] for tasks assessed/performed             Past Medical History:  Diagnosis Date   Cluster headache     Past Surgical History:  Procedure Laterality Date   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 Right 02/28/2020    There were no vitals filed for this visit.   Subjective Assessment - 01/07/21 0857     Subjective Pt reports he has an appt. c Dr. Ardelle Anton on 01/09/21. He reports the pain is the same. He had increased pain after approx 1/4 mile of walking. Pt states this is the longest distance he has walked in a while. Pt reports the intophoresis treatment helps his R ant ankle/dorsal foot pain the most.    Pertinent History 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    Limitations Standing;Walking    Diagnostic tests 12/21/20: IMPRESSION:  1. 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  2.  No acute osseous injury of the right ankle. Past MRI and X-rays last Summer.    Currently in Pain? Yes    Pain Score 5    8/10 at the of the day.   Pain Location Foot   ankle.   Pain Orientation Right    Pain Descriptors / Indicators Aching    Pain Type Chronic pain    Pain Onset More than a month ago     Pain Frequency Intermittent                [facility] PT Assessment - 01/08/21 0001       Observation/Other Assessments   Focus on Therapeutic Outcomes (FOTO)  50% functional ability                           [facility] Adult PT Treatment/Exercise - 01/08/21 0001       Knee/Hip Exercises: Standing   Other Standing Knee Exercises SL mini squat on Bosu ball with R foot on R outer edge of ball in // bars 2x10. Band exs c red Tband at feet, Side steps 15x4 each.    Other Standing Knee Exercises Marching on Bosu ball in // bars 3x30sec;      Manual Therapy   Soft tissue mobilization STM to anterior tibialis and peroneals on R with trigger point release provided      Ankle Exercises: Standing   Heel Raises Both;10 reps   ball between ankles.                        PT Long Term Goals - 12/04/20 0746       PT LONG TERM GOAL #1  Title Independent with HEP    Status Achieved    Target Date 01/25/21      PT LONG TERM GOAL #2   Title Improve FOTO outcome measure score to 67% or greater functional status. 12/03/20: 41%    Baseline 41%    Status On-going    Target Date 01/25/21      PT LONG TERM GOAL #3   Title Increase right ankle DF AROM at least 5 deg or greater to improve toe clearance for gait and for decreased gastroc tightness and improved ankle mobility to decrease pain with standing and walking. 12/03/20: pt walks c an min antalgic gait. There is ealry heel off and a medial heel whip at end of stance related to PF/heel cord tightness    Baseline 0 deg    Status On-going      PT LONG TERM GOAL #4   Title Tolerate standing as needed for work shifts with heel pain <3/10 at worst. 12/03/20: Baseline pain is 5/10    Baseline 9-10/10 at worst with prolonged standing at work    Status On-going    Target Date 01/25/21      PT LONG TERM GOAL #5   Title Increase bilateral ankle strength to grossly 5/5 to improve ankle stability for outdoor ambulation over  uneven surfaces. 12/03/20: NT    Baseline see objective    Status On-going    Target Date 01/25/21                   Plan - 01/07/21 0849     Clinical Impression Statement PT was completed for STM and trigger point release for the anterior tib and peroneals. Continued strengthening exercises for R ankle/LE to improve function. Pt's subjective reportand FOTO score, 41% to 50%, indicate improve functional ability and tolerance to activity. Ionto was provided post exercise to address R ant. ankle and dorsal foot pain. Pt tolerated the PT session without adverse effects.    Personal Factors and Comorbidities Profession    Examination-Activity Limitations Lift;Locomotion Level;Stand    Examination-Participation Restrictions Occupation;Community Activity    Stability/Clinical Decision Making Evolving/Moderate complexity    Clinical Decision Making Moderate    Rehab Potential Good    PT Frequency 2x / week    PT Duration 6 weeks    PT Treatment/Interventions ADLs/Self Care Home Management;Cryotherapy;Electrical Stimulation;Iontophoresis 4mg /ml Dexamethasone;Moist Heat;Functional mobility training;Therapeutic activities;Gait training;Therapeutic exercise;Patient/family education;Manual techniques;Neuromuscular re-education;Taping;Dry needling;Passive range of motion;Aquatic Therapy    PT Next Visit Plan Trial dry needling to R peroneal musculature. Work on foot intrinsic, ankle, and hip strengthening with an added focus on anterior tibialis and peroneal musculature. Continue manual with ankle mobs and STM.    PT Home Exercise Plan Access code: [email or web address]    Consulted and Agree with Plan of Care Patient             Patient will benefit from skilled therapeutic intervention in order to improve the following deficits and impairments:  Pain, Impaired flexibility, Increased fascial restricitons, Decreased strength, Decreased activity tolerance, Decreased range of motion, Difficulty walking,  Hypomobility  Visit Diagnosis: Pain in right foot  Difficulty in walking, not elsewhere classified  Muscle weakness (generalized)  Other abnormalities of gait and mobility  Pain in left foot     Problem List Patient Active Problem List   Diagnosis Date Noted   Hemorrhoid 09/17/2016   Cluster headaches 10/13/2013     10/15/2013 MS, PT 01/08/21 5:12 AM  [facility] [address]  [address] [place], [place], [address] Phone: [phone]   Fax:  [phone]  Name: Camden Knotek MRN: [id number] Date of Birth: [birth year]

## 2021-01-09 ENCOUNTER — Ambulatory Visit: Payer: BC Managed Care – PPO

## 2021-01-09 ENCOUNTER — Ambulatory Visit (INDEPENDENT_AMBULATORY_CARE_PROVIDER_SITE_OTHER): Payer: No Typology Code available for payment source | Admitting: Podiatry

## 2021-01-09 ENCOUNTER — Encounter: Payer: Self-pay | Admitting: Podiatry

## 2021-01-09 ENCOUNTER — Other Ambulatory Visit: Payer: Self-pay

## 2021-01-09 DIAGNOSIS — M79671 Pain in right foot: Secondary | ICD-10-CM

## 2021-01-09 DIAGNOSIS — R2689 Other abnormalities of gait and mobility: Secondary | ICD-10-CM

## 2021-01-09 DIAGNOSIS — R262 Difficulty in walking, not elsewhere classified: Secondary | ICD-10-CM

## 2021-01-09 DIAGNOSIS — M7751 Other enthesopathy of right foot: Secondary | ICD-10-CM | POA: Diagnosis not present

## 2021-01-09 DIAGNOSIS — Q6689 Other  specified congenital deformities of feet: Secondary | ICD-10-CM

## 2021-01-09 DIAGNOSIS — M6281 Muscle weakness (generalized): Secondary | ICD-10-CM | POA: Diagnosis not present

## 2021-01-09 DIAGNOSIS — M79672 Pain in left foot: Secondary | ICD-10-CM

## 2021-01-10 NOTE — Therapy (Signed)
[facility]patient Rehabilitation [facility] [address] [place], [place], [address] Phone: [phone]   Fax:  [phone]  Physical Therapy Treatment  Patient Details  Name: Michael Ramirez MRN: [id number] Date of Birth: 12-Mar-1981 Referring Provider (PT): Ovid Curd, DPM   Encounter Date: 01/09/2021   PT End of Session - 01/10/21 1131     Visit Number 12    Number of Visits 18    Date for PT Re-Evaluation 01/25/21    Authorization Type BCBS    PT Start Time 1100    PT Stop Time 1145    PT Time Calculation (min) 45 min    Activity Tolerance Patient tolerated treatment well    Behavior During Therapy [facility] for tasks assessed/performed             Past Medical History:  Diagnosis Date   Cluster headache     Past Surgical History:  Procedure Laterality Date   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 Right 02/28/2020    There were no vitals filed for this visit.   Subjective Assessment - 01/09/21 1108     Subjective Pt reports the iontophoresis helped with his R foot night pain. Pt states he started having an ache with his R foot as he has coming into therapy    Pertinent History 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    Diagnostic tests 12/21/20: IMPRESSION:  1. 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  2.  No acute osseous injury of the right ankle. Past MRI and X-rays last Summer.    Patient Stated Goals Resolve heel pain, work without pain    Currently in Pain? Yes    Pain Score 7     Pain Location Foot   ankle   Pain Orientation Right    Pain Descriptors / Indicators Aching    Pain Type Chronic pain    Pain Onset More than a month ago    Pain Frequency Intermittent                [facility] PT Assessment - 01/10/21 0001       AROM   Right Ankle Dorsiflexion 9                            [facility] Adult PT Treatment/Exercise - 01/10/21 0001       Knee/Hip Exercises: Standing   Other Standing Knee Exercises DL and SL mini squats on Bosu ball with R foot on R outer edge of ball in // bars 10x each.    Other Standing Knee Exercises Marching on Bosu ball in // bars 3x30sec;      Manual Therapy   Manual Therapy Joint mobilization    Joint Mobilization grade lll talocrural ditraction and AP glides to increase R ankle DF    Soft tissue mobilization STM to anterior tibialis and peroneals on R with trigger point release provided      Ankle Exercises: Aerobic   Stationary Bike 5 mins; L3      Ankle Exercises: Stretches   Slant Board Stretch 2 reps;60 seconds      Ankle Exercises: Standing   Heel Raises Both;20 reps   ball between ankles.                        PT Long Term Goals - 12/04/20 5366  PT LONG TERM GOAL #1   Title Independent with HEP    Status Achieved    Target Date 01/25/21      PT LONG TERM GOAL #2   Title Improve FOTO outcome measure score to 67% or greater functional status. 12/03/20: 41%    Baseline 41%    Status On-going    Target Date 01/25/21      PT LONG TERM GOAL #3   Title Increase right ankle DF AROM at least 5 deg or greater to improve toe clearance for gait and for decreased gastroc tightness and improved ankle mobility to decrease pain with standing and walking. 12/03/20: pt walks c an min antalgic gait. There is ealry heel off and a medial heel whip at end of stance related to PF/heel cord tightness    Baseline 0 deg    Status On-going      PT LONG TERM GOAL #4   Title Tolerate standing as needed for work shifts with heel pain <3/10 at worst. 12/03/20: Baseline pain is 5/10    Baseline 9-10/10 at worst with prolonged standing at work    Status On-going    Target Date 01/25/21      PT LONG TERM GOAL #5   Title Increase bilateral ankle strength to grossly 5/5 to improve ankle stability for  outdoor ambulation over uneven surfaces. 12/03/20: NT    Baseline see objective    Status On-going    Target Date 01/25/21                   Plan - 01/10/21 1133     Clinical Impression Statement Pt has an appt with Dr. Ardelle Anton this afternoon. PT was completed for STM and trigger pont release of the R peroneal and anterior tibial musclulature. Additional ankle mobs were completed to improve R ankle DF AROM. DF has improved to 9d, however pt's gait pattern c early heel off c medial heel whip continues. Progress to date re: pain and function has been slow. Pt tolerated the PT session without adverse effects.    Personal Factors and Comorbidities Profession    Examination-Activity Limitations Lift;Locomotion Level;Stand    Examination-Participation Restrictions Occupation;Community Activity    Stability/Clinical Decision Making Evolving/Moderate complexity    Clinical Decision Making Moderate    Rehab Potential Good    PT Frequency 2x / week    PT Treatment/Interventions ADLs/Self Care Home Management;Cryotherapy;Electrical Stimulation;Iontophoresis 4mg /ml Dexamethasone;Moist Heat;Functional mobility training;Therapeutic activities;Gait training;Therapeutic exercise;Patient/family education;Manual techniques;Neuromuscular re-education;Taping;Dry needling;Passive range of motion;Aquatic Therapy    PT Next Visit Plan Trial dry needling to R peroneal musculature. Work on foot intrinsic, ankle, and hip strengthening with an added focus on anterior tibialis and peroneal musculature. Continue manual with ankle mobs and STM.    PT Home Exercise Plan Access code: [email or web address]    Consulted and Agree with Plan of Care Patient             Patient will benefit from skilled therapeutic intervention in order to improve the following deficits and impairments:  Pain, Impaired flexibility, Increased fascial restricitons, Decreased strength, Decreased activity tolerance, Decreased range of motion,  Difficulty walking, Hypomobility  Visit Diagnosis: Pain in right foot  Difficulty in walking, not elsewhere classified  Muscle weakness (generalized)  Other abnormalities of gait and mobility  Pain in left foot     Problem List Patient Active Problem List   Diagnosis Date Noted   Hemorrhoid 09/17/2016   Cluster headaches 10/13/2013   10/15/2013 MS, PT 01/10/21  11:48 AM   [facility] [address] [place], [place], [address] Phone: [phone]   Fax:  [phone]  Name: Michael Ramirez MRN: [id number] Date of Birth: Jan 07, 1981

## 2021-01-13 NOTE — Progress Notes (Signed)
Subjective: 40 year old male presents the office today for concerns of injury that happened at work on November 04, 2020 while getting on the elevator.  Prior to the injury he states that the foot was feeling much better and he is able to walk.  He is doing physical therapy.  Also presents to discuss MRI results in further detail.  He states the injection did help however when the medicine wears off he gets worsening pain.  Due to this he was a hold off on further injections.  No recent injury or changes otherwise since I last saw him   Objective: AAO x3, NAD DP/PT pulses palpable bilaterally, CRT less than 3 seconds Majority tenderness appears to be the lateral aspect of the foot on the sinus tarsi but also today.  There is no area of pinpoint tenderness.  That this restriction of subtalar joint range of motion but does not change.  There is no other areas of pinpoint tenderness identified today.  Trace edema there is no erythema or warmth. MMT 5/5.  No pain with calf compression, swelling, warmth, erythema  Assessment: Capsulitis right foot secondary to injury; plan fasciitis  Plan: -All treatment options discussed with the patient including all alternatives, risks, complications.  -MRI reviewed with him. -We will hold off another steroid injection. -Discussed conservative as well as surgical options.  For now continue with conservative treatment after discussion.  I am going to make him a new orthotic with a deep heel cup.  Also continue physical therapy.  He can use the muscle relaxers as needed.  In the future if needed discussed further surgical intervention.  On acute mental work with her 8 weeks.  He will get the inserts back in the next 4 weeks and he can break them in and finished physical therapy before going back.  I discussed this with his case manager today, Luz Brazen.   Vivi Barrack DPM

## 2021-01-14 ENCOUNTER — Other Ambulatory Visit: Payer: Self-pay

## 2021-01-14 ENCOUNTER — Ambulatory Visit: Payer: BC Managed Care – PPO

## 2021-01-14 DIAGNOSIS — R262 Difficulty in walking, not elsewhere classified: Secondary | ICD-10-CM

## 2021-01-14 DIAGNOSIS — R2689 Other abnormalities of gait and mobility: Secondary | ICD-10-CM | POA: Diagnosis not present

## 2021-01-14 DIAGNOSIS — M79672 Pain in left foot: Secondary | ICD-10-CM | POA: Diagnosis not present

## 2021-01-14 DIAGNOSIS — M6281 Muscle weakness (generalized): Secondary | ICD-10-CM

## 2021-01-14 DIAGNOSIS — M79671 Pain in right foot: Secondary | ICD-10-CM | POA: Diagnosis not present

## 2021-01-14 NOTE — Therapy (Signed)
[facility] Out[facility] [address] [place], [place], [address] Phone: [phone]   Fax:  [phone]  Physical Therapy Treatment  Patient Details  Name: Michael Ramirez MRN: [id number] Date of Birth: [birth year] Referring Provider (PT): Ovid Curd, DPM   Encounter Date: 01/14/2021   PT End of Session - 01/14/21 1149     Visit Number 13    Number of Visits 18    Date for PT Re-Evaluation 01/25/21    Authorization Type BCBS    PT Start Time 1148    PT Stop Time 1230    PT Time Calculation (min) 42 min    Activity Tolerance Patient tolerated treatment well    Behavior During Therapy [facility] for tasks assessed/performed             Past Medical History:  Diagnosis Date   Cluster headache     Past Surgical History:  Procedure Laterality Date   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 Right 02/28/2020    There were no vitals filed for this visit.   Subjective Assessment - 01/14/21 1305     Subjective Pt reports Dr. Ardelle Anton wants him to complete PT and that Dr. Ardelle Anton is going to have new orthotics made. Pt indicates the pain on the front of his R foot is improving, while the lat/post foot pain continues to be painful.    Pertinent History Right peroneal tendon repair with tarsal coalition resection 02/28/20    Diagnostic tests 12/21/20: IMPRESSION:  1. 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  2.  No acute osseous injury of the right ankle. Past MRI and X-rays last Summer.    Patient Stated Goals Resolve heel pain, work without pain    Currently in Pain? Yes    Pain Score 5     Pain Location Ankle    Pain Orientation Right;Posterior;Lateral    Pain Descriptors / Indicators Aching    Pain Type Chronic pain    Pain Onset More than a month ago    Pain Frequency Intermittent    Aggravating Factors  prolonged standing and  walking    Pain Relieving Factors rest    Pain Score 3    Pain Location Foot    Pain Orientation Right;Anterior    Pain Descriptors / Indicators Aching    Pain Type Chronic pain    Pain Onset More than a month ago    Pain Frequency Intermittent                               [facility] Adult PT Treatment/Exercise - 01/14/21 0001       Knee/Hip Exercises: Standing   Other Standing Knee Exercises DL and SL mini squats on Bosu ball with feet/foot on outer edge of ball in // bars 10x each. UEs as needed. Marching on Bosu ball in // bars 2x30sec.    Other Standing Knee Exercises Lateral side steps c green Tband; 9ftx4      Manual Therapy   Manual Therapy Joint mobilization;Soft tissue mobilization    Joint Mobilization grade lll talocrural ditraction and AP glides to increase R ankle DF    Soft tissue mobilization STM to anterior tibialis and peroneals on R with trigger point release provided    Passive ROM maual stretching into DF and PF      Ankle Exercises: Stretches   Theme park manager  3 reps;60 seconds    Other Stretch strap                         PT Long Term Goals - 12/04/20 0746       PT LONG TERM GOAL #1   Title Independent with HEP    Status Achieved    Target Date 01/25/21      PT LONG TERM GOAL #2   Title Improve FOTO outcome measure score to 67% or greater functional status. 12/03/20: 41%    Baseline 41%    Status On-going    Target Date 01/25/21      PT LONG TERM GOAL #3   Title Increase right ankle DF AROM at least 5 deg or greater to improve toe clearance for gait and for decreased gastroc tightness and improved ankle mobility to decrease pain with standing and walking. 12/03/20: pt walks c an min antalgic gait. There is ealry heel off and a medial heel whip at end of stance related to PF/heel cord tightness    Baseline 0 deg    Status On-going      PT LONG TERM GOAL #4   Title Tolerate standing as needed for work shifts with  heel pain <3/10 at worst. 12/03/20: Baseline pain is 5/10    Baseline 9-10/10 at worst with prolonged standing at work    Status On-going    Target Date 01/25/21      PT LONG TERM GOAL #5   Title Increase bilateral ankle strength to grossly 5/5 to improve ankle stability for outdoor ambulation over uneven surfaces. 12/03/20: NT    Baseline see objective    Status On-going    Target Date 01/25/21                   Plan - 01/14/21 1313     Clinical Impression Statement PT was provided to address pain, ROM, and strength of the R ankle/foot with STM/trigger point release, joint mobs/PROM, and CKC/ankle stability exs completed.The most acute anterior R ankle pain appears to be improving, while the chronic post sugery pain continues to be a prominent issue. With stability exs on the bosu, pt is able to complete with decrease UE assistance.    Personal Factors and Comorbidities Profession    Examination-Activity Limitations Lift;Locomotion Level;Stand    Examination-Participation Restrictions Occupation;Community Activity    Stability/Clinical Decision Making Evolving/Moderate complexity    Clinical Decision Making Moderate    Rehab Potential Good    PT Frequency 2x / week    PT Duration 6 weeks    PT Treatment/Interventions ADLs/Self Care Home Management;Cryotherapy;Electrical Stimulation;Iontophoresis 4mg /ml Dexamethasone;Moist Heat;Functional mobility training;Therapeutic activities;Gait training;Therapeutic exercise;Patient/family education;Manual techniques;Neuromuscular re-education;Taping;Dry needling;Passive range of motion;Aquatic Therapy    PT Next Visit Plan Trial dry needling to R peroneal musculature. Work on foot intrinsic, ankle, and hip strengthening with an added focus on anterior tibialis and peroneal musculature. Continue manual with ankle mobs and STM.    PT Home Exercise Plan Access code: [email or web address]    Consulted and Agree with Plan of Care Patient              Patient will benefit from skilled therapeutic intervention in order to improve the following deficits and impairments:  Pain, Impaired flexibility, Increased fascial restricitons, Decreased strength, Decreased activity tolerance, Decreased range of motion, Difficulty walking, Hypomobility  Visit Diagnosis: Pain in right foot  Difficulty in walking, not elsewhere classified  Muscle weakness (generalized)  Other  abnormalities of gait and mobility  Pain in left foot     Problem List Patient Active Problem List   Diagnosis Date Noted   Hemorrhoid 09/17/2016   Cluster headaches 10/13/2013    Joellyn Rued MS, PT 01/14/21 4:16 PM   [facility] Out[facility] Rehabilitation [facility] [address] [place], [place], [address] Phone: [phone]   Fax:  [phone]  Name: Michael Ramirez MRN: [id number] Date of Birth: [birth year]

## 2021-01-15 ENCOUNTER — Encounter: Payer: Self-pay | Admitting: Physical Therapy

## 2021-01-15 ENCOUNTER — Ambulatory Visit: Payer: BC Managed Care – PPO | Admitting: Physical Therapy

## 2021-01-15 DIAGNOSIS — R2689 Other abnormalities of gait and mobility: Secondary | ICD-10-CM

## 2021-01-15 DIAGNOSIS — M6281 Muscle weakness (generalized): Secondary | ICD-10-CM | POA: Diagnosis not present

## 2021-01-15 DIAGNOSIS — M79671 Pain in right foot: Secondary | ICD-10-CM

## 2021-01-15 DIAGNOSIS — R262 Difficulty in walking, not elsewhere classified: Secondary | ICD-10-CM

## 2021-01-15 DIAGNOSIS — M79672 Pain in left foot: Secondary | ICD-10-CM

## 2021-01-15 NOTE — Therapy (Signed)
[facility] [address] [place], [place], [address] Phone: [phone]   Fax:  [phone]  Physical Therapy Treatment  Patient Details  Name: Michael Ramirez MRN: [id number] Date of Birth: Oct 06, 1980 Referring Provider (PT): Ovid Curd, DPM   Encounter Date: 01/15/2021   PT End of Session - 01/15/21 1020     Visit Number 14    Number of Visits 18    Date for PT Re-Evaluation 01/25/21    Authorization Type BCBS    PT Start Time 1018    PT Stop Time 1059    PT Time Calculation (min) 41 min    Activity Tolerance Patient tolerated treatment well    Behavior During Therapy [facility] for tasks assessed/performed             Past Medical History:  Diagnosis Date   Cluster headache     Past Surgical History:  Procedure Laterality Date   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 Right 02/28/2020    There were no vitals filed for this visit.   Subjective Assessment - 01/15/21 1022     Subjective "I still get alot of aching in the muscle along the side of my R leg. I am still having that pain a fair amout at night during the day I don't notice it at much."    Diagnostic tests 12/21/20: IMPRESSION:  1. 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  2.  No acute osseous injury of the right ankle. Past MRI and X-rays last Summer.    Currently in Pain? Yes    Pain Score 7     Pain Orientation Right    Pain Type Chronic pain    Pain Onset More than a month ago    Pain Frequency Intermittent                [facility] PT Assessment - 01/15/21 0001       Assessment   Medical Diagnosis Plantar fasciitis (bilateral right>left), equinus                           [facility] Adult PT Treatment/Exercise - 01/15/21 0001       Knee/Hip Exercises: Aerobic   Elliptical L1 x 5 min ramp L1      Knee/Hip Exercises:  Standing   Gait Training heel strike/ toe off bil with shorter stride to reduce antalgic pattern.      Manual Therapy   Manual therapy comments skilled palpation and monitoring of pt during TPDN    Soft tissue mobilization IASTM along the R peroneals      Ankle Exercises: Standing   Rocker Board 2 minutes   DF/PF, and with inversion/ eversion SLS R only   Heel Raises 20 reps   on airexpad     Ankle Exercises: Stretches   Slant Board Stretch 30 seconds;4 reps   2 x gastroc 2 x soleus             Trigger Point Dry Needling - 01/15/21 0001     Consent Given? Yes    Education Handout Provided Yes    Muscles Treated Lower Quadrant Peroneals;Gastrocnemius;Soleus    Peroneals Response Twitch response elicited;Palpable increased muscle length   longus/ brevis R only   Gastrocnemius Response Twitch response elicited;Palpable increased muscle length   R   Soleus Response Twitch response elicited;Palpable increased muscle length  R              Balance Exercises - 01/15/21 0001       Balance Exercises: Standing   SLS Eyes open;3 reps;30 secs   able to hold max of about 8 seconds before usingUE to stabilize              PT Education - 01/15/21 1033     Education Details muscle anatomy and referral patterns, What TPDN and benefits of treatment    Person(s) Educated Patient    Methods Explanation;Verbal cues    Comprehension Verbalized understanding;Verbal cues required                 PT Long Term Goals - 12/04/20 0746       PT LONG TERM GOAL #1   Title Independent with HEP    Status Achieved    Target Date 01/25/21      PT LONG TERM GOAL #2   Title Improve FOTO outcome measure score to 67% or greater functional status. 12/03/20: 41%    Baseline 41%    Status On-going    Target Date 01/25/21      PT LONG TERM GOAL #3   Title Increase right ankle DF AROM at least 5 deg or greater to improve toe clearance for gait and for decreased gastroc tightness  and improved ankle mobility to decrease pain with standing and walking. 12/03/20: pt walks c an min antalgic gait. There is ealry heel off and a medial heel whip at end of stance related to PF/heel cord tightness    Baseline 0 deg    Status On-going      PT LONG TERM GOAL #4   Title Tolerate standing as needed for work shifts with heel pain <3/10 at worst. 12/03/20: Baseline pain is 5/10    Baseline 9-10/10 at worst with prolonged standing at work    Status On-going    Target Date 01/25/21      PT LONG TERM GOAL #5   Title Increase bilateral ankle strength to grossly 5/5 to improve ankle stability for outdoor ambulation over uneven surfaces. 12/03/20: NT    Baseline see objective    Status On-going    Target Date 01/25/21                   Plan - 01/15/21 1034     Clinical Impression Statement pt reports continued pin inthe R lateral shin. educated and consent was provided for TPDN focuing on along the peroneals and gastroc/soleus on the R followed with IASTM techiqeus. continued working on  [facility] ankle strengthening and balance training. reviewed gait pattern focusing on heel strike/ toe off with emphasis on avoiding antalgic pattern to reduce compensation.    PT Treatment/Interventions ADLs/Self Care Home Management;Cryotherapy;Electrical Stimulation;Iontophoresis 4mg /ml Dexamethasone;Moist Heat;Functional mobility training;Therapeutic activities;Gait training;Therapeutic exercise;Patient/family education;Manual techniques;Neuromuscular re-education;Taping;Dry needling;Passive range of motion;Aquatic Therapy    PT Next Visit Plan response dry needling to R peroneal musculature. Work on foot intrinsic, ankle, and hip strengthening with an added focus on anterior tibialis and peroneal musculature. Continue manual with ankle mobs and STM.    PT Home Exercise Plan Access code: [email or web address]    Consulted and Agree with Plan of Care Patient             Patient will benefit from skilled  therapeutic intervention in order to improve the following deficits and impairments:  Pain, Impaired flexibility, Increased fascial restricitons, Decreased strength, Decreased activity tolerance, Decreased range  of motion, Difficulty walking, Hypomobility  Visit Diagnosis: Pain in right foot  Difficulty in walking, not elsewhere classified  Muscle weakness (generalized)  Other abnormalities of gait and mobility  Pain in left foot     Problem List Patient Active Problem List   Diagnosis Date Noted   Hemorrhoid 09/17/2016   Cluster headaches 10/13/2013   Lulu Riding PT, DPT, LAT, ATC  01/15/21  11:02 AM      [facility] [address] [place], [place], [address] Phone: [phone]   Fax:  [phone]  Name: Michael Ramirez MRN: [id number] Date of Birth: [birth year]

## 2021-01-16 ENCOUNTER — Ambulatory Visit: Payer: BC Managed Care – PPO

## 2021-01-21 ENCOUNTER — Other Ambulatory Visit: Payer: Self-pay

## 2021-01-21 ENCOUNTER — Ambulatory Visit: Payer: BC Managed Care – PPO

## 2021-01-21 DIAGNOSIS — M6281 Muscle weakness (generalized): Secondary | ICD-10-CM

## 2021-01-21 DIAGNOSIS — R2689 Other abnormalities of gait and mobility: Secondary | ICD-10-CM | POA: Diagnosis not present

## 2021-01-21 DIAGNOSIS — R262 Difficulty in walking, not elsewhere classified: Secondary | ICD-10-CM | POA: Diagnosis not present

## 2021-01-21 DIAGNOSIS — M79672 Pain in left foot: Secondary | ICD-10-CM | POA: Diagnosis not present

## 2021-01-21 DIAGNOSIS — M79671 Pain in right foot: Secondary | ICD-10-CM | POA: Diagnosis not present

## 2021-01-22 NOTE — Therapy (Signed)
[facility] [address] [place], [place], [address] Phone: [phone]   Fax:  [phone]  Physical Therapy Treatment  Patient Details  Name: Michael Ramirez MRN: [id number] Date of Birth: 1981-06-11 Referring Provider (PT): Ovid Curd, DPM   Encounter Date: 01/21/2021   PT End of Session - 01/22/21 0143     Visit Number 15    Number of Visits 18    Date for PT Re-Evaluation 01/25/21    Authorization Type BCBS    PT Start Time 1015    PT Stop Time 1100    PT Time Calculation (min) 45 min    Activity Tolerance Patient tolerated treatment well    Behavior During Therapy [facility] for tasks assessed/performed             Past Medical History:  Diagnosis Date   Cluster headache     Past Surgical History:  Procedure Laterality Date   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 Right 02/28/2020    There were no vitals filed for this visit.   Subjective Assessment - 01/22/21 0221     Subjective Pt reports the front ankle pain is progressively feeling better, while the lateral posterior heel pain continues to bother him significantly    Pertinent History Right peroneal tendon repair with tarsal coalition resection 02/28/20    Diagnostic tests 12/21/20: IMPRESSION:  1. 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  2.  No acute osseous injury of the right ankle. Past MRI and X-rays last Summer.    Patient Stated Goals Resolve heel pain, work without pain    Currently in Pain? Yes    Pain Score 7     Pain Location Ankle    Pain Orientation Right;Posterior    Pain Descriptors / Indicators Aching    Pain Type Chronic pain    Pain Radiating Towards right calf region intermittently    Pain Onset More than a month ago    Pain Frequency Intermittent    Aggravating Factors  prolonged standing and walking    Pain Relieving  Factors Rest    Pain Score 3    Pain Location Foot    Pain Orientation Right;Anterior    Pain Descriptors / Indicators Aching    Pain Type Chronic pain    Pain Onset More than a month ago    Pain Frequency Intermittent    Aggravating Factors  Prolonged standing    Pain Relieving Factors Rest                               [facility] Adult PT Treatment/Exercise - 01/22/21 0001       Manual Therapy   Soft tissue mobilization STM to anterior tibialis and peroneals on R with trigger point release provided      Ankle Exercises: Standing   SLS 1 min x 2; green therapad for balance and stability    Rocker Board 2 minutes   DF/PF, and with inversion/ eversion SLS R only   Heel Raises 20 reps   on airexpad     Ankle Exercises: Stretches   Slant Board Stretch 2 reps;30 seconds      Ankle Exercises: Aerobic   Stationary Bike 5 mins; L3                    PT Education - 01/22/21 6195  Education Details DIscussed with the pt to try wearing a hiking boots to see if a firm shank and rocker style sole helps to reduce the strain of his R foot    Person(s) Educated Patient    Methods Explanation    Comprehension Verbalized understanding                 PT Long Term Goals - 12/04/20 0746       PT LONG TERM GOAL #1   Title Independent with HEP    Status Achieved    Target Date 01/25/21      PT LONG TERM GOAL #2   Title Improve FOTO outcome measure score to 67% or greater functional status. 12/03/20: 41%    Baseline 41%    Status On-going    Target Date 01/25/21      PT LONG TERM GOAL #3   Title Increase right ankle DF AROM at least 5 deg or greater to improve toe clearance for gait and for decreased gastroc tightness and improved ankle mobility to decrease pain with standing and walking. 12/03/20: pt walks c an min antalgic gait. There is ealry heel off and a medial heel whip at end of stance related to PF/heel cord tightness    Baseline 0 deg     Status On-going      PT LONG TERM GOAL #4   Title Tolerate standing as needed for work shifts with heel pain <3/10 at worst. 12/03/20: Baseline pain is 5/10    Baseline 9-10/10 at worst with prolonged standing at work    Status On-going    Target Date 01/25/21      PT LONG TERM GOAL #5   Title Increase bilateral ankle strength to grossly 5/5 to improve ankle stability for outdoor ambulation over uneven surfaces. 12/03/20: NT    Baseline see objective    Status On-going    Target Date 01/25/21                   Plan - 01/22/21 0147     Clinical Impression Statement Pt reports responding well to the TPDN from last session with several days of good relief. Pt's subjective report indicates improvement of the anterior foot pain, while the lateral heel persists and is painful with walking. Strengthneing and balance exs were continued. Recommended pt to try wearing a hiking boots with a stiiff shank and rocker type sole to see if that style of shoe is helpful with decreasing his R foot paiin.    Personal Factors and Comorbidities Profession    Examination-Activity Limitations Lift;Locomotion Level;Stand    Examination-Participation Restrictions Occupation;Community Activity    Stability/Clinical Decision Making Evolving/Moderate complexity    Clinical Decision Making Moderate    Rehab Potential Good    PT Frequency 2x / week    PT Duration 6 weeks    PT Treatment/Interventions ADLs/Self Care Home Management;Cryotherapy;Electrical Stimulation;Iontophoresis 4mg /ml Dexamethasone;Moist Heat;Functional mobility training;Therapeutic activities;Gait training;Therapeutic exercise;Patient/family education;Manual techniques;Neuromuscular re-education;Taping;Dry needling;Passive range of motion;Aquatic Therapy    PT Next Visit Plan response dry needling to R peroneal musculature. Work on foot intrinsic, ankle, and hip strengthening with an added focus on anterior tibialis and peroneal musculature.  Continue manual with ankle mobs and STM.    PT Home Exercise Plan Access code: [email or web address]    Consulted and Agree with Plan of Care Patient             Patient will benefit from skilled therapeutic intervention in order to improve the  following deficits and impairments:  Pain, Impaired flexibility, Increased fascial restricitons, Decreased strength, Decreased activity tolerance, Decreased range of motion, Difficulty walking, Hypomobility  Visit Diagnosis: Pain in right foot  Difficulty in walking, not elsewhere classified  Muscle weakness (generalized)  Other abnormalities of gait and mobility  Pain in left foot     Problem List Patient Active Problem List   Diagnosis Date Noted   Hemorrhoid 09/17/2016   Cluster headaches 10/13/2013    Joellyn Rued MS, PT 01/22/21 2:26 AM   [facility] [address] [place], [place], [address] Phone: [phone]   Fax:  [phone]  Name: Michael Ramirez MRN: [id number] Date of Birth: [birth year]

## 2021-01-23 ENCOUNTER — Other Ambulatory Visit: Payer: Self-pay

## 2021-01-23 ENCOUNTER — Ambulatory Visit: Payer: BC Managed Care – PPO

## 2021-01-23 DIAGNOSIS — R2689 Other abnormalities of gait and mobility: Secondary | ICD-10-CM | POA: Diagnosis not present

## 2021-01-23 DIAGNOSIS — M79672 Pain in left foot: Secondary | ICD-10-CM

## 2021-01-23 DIAGNOSIS — M6281 Muscle weakness (generalized): Secondary | ICD-10-CM | POA: Diagnosis not present

## 2021-01-23 DIAGNOSIS — R262 Difficulty in walking, not elsewhere classified: Secondary | ICD-10-CM | POA: Diagnosis not present

## 2021-01-23 DIAGNOSIS — M79671 Pain in right foot: Secondary | ICD-10-CM

## 2021-01-24 NOTE — Therapy (Signed)
[place], [place], [address] Phone: [phone]   Fax:  [phone]  Physical Therapy Treatment/Discharge  Patient Details  Name: Angelus Hoopes MRN: [id number] Date of Birth: [birth year] Referring Provider (PT): Celesta Gentile, DPM   Encounter Date: 01/23/2021     Past Medical History:  Diagnosis Date   Cluster headache     Past Surgical History:  Procedure Laterality Date   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 Right 02/28/2020    There were no vitals filed for this visit.   Subjective Assessment - 01/24/21 0730     Subjective Pt reports no signiifcant improvement with his R ankle and foot pain. When his activity is low, his pain is generally lower, but when he walks alot or walks on uneven surfaces his pain increases. Usually, the R lateral ankle and border of the heel bothers him the most, but today. the ant. lower leg, ankle, foot is hurting more.    Pertinent History Right peroneal tendon repair with tarsal coalition resection 02/28/20    Limitations Standing;Walking    Diagnostic tests 12/21/20: IMPRESSION:  1. 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  2.  No acute osseous injury of the right ankle. Past MRI and X-rays last Summer.    Patient Stated Goals Resolve heel pain, work without pain    Currently in Pain? Yes    Pain Score 2     Pain Location Ankle   heel   Pain Orientation Lateral    Pain Descriptors / Indicators Aching    Pain Type Chronic pain    Pain Onset More than a month ago    Pain Frequency Intermittent    Aggravating Factors  prolonged standing and walking    Pain Relieving Factors Rest    Pain Score 7    Pain Location Foot   lower leg, ankle   Pain Orientation Right;Anterior    Pain Descriptors / Indicators Aching    Pain Type Chronic pain    Pain Onset More  than a month ago    Pain Frequency Intermittent    Aggravating Factors  Prolonged standing, walking    Pain Relieving Factors Rest                [facility] PT Assessment - 01/24/21 0001       AROM   Right Ankle Dorsiflexion 3    Right Ankle Plantar Flexion 50    Right Ankle Inversion 10    Right Ankle Eversion 15      Strength   Right Ankle Dorsiflexion 5/5    Right Ankle Plantar Flexion 5/5    Right Ankle Inversion 5/5    Right Ankle Eversion 5/5                           [facility] Adult PT Treatment/Exercise - 01/24/21 0001       Knee/Hip Exercises: Standing   Forward Lunges Right;Left;15 reps    Forward Lunges Limitations lunge L is decreased due to R achilles tightness and lateral ankle/heel pain    Other Standing Knee Exercises Dead lift, 25#; Band exs c green Tband. Side steps, diagonal walks forward/backward; 20x2 each.    Other Standing Knee Exercises Farmer's carry, 20#x2, 19ft                    PT Education - 01/24/21  [place]     Education Details HEP    Person(s) Educated Patient    Methods Explanation;Demonstration;Tactile cues;Verbal cues;Handout    Comprehension Verbalized understanding;Returned demonstration;Verbal cues required;Tactile cues required;Need further instruction                 PT Long Term Goals - 01/24/21 1329       PT LONG TERM GOAL #1   Title Independent with HEP    Baseline instructed at eval    Status Achieved    Target Date 01/23/21      PT LONG TERM GOAL #2   Title Improve FOTO outcome measure score to 67% or greater functional status. 12/03/20: 41% 01/18/21: 50%    Status Not Met    Target Date 01/23/21      PT LONG TERM GOAL #3   Title Increase right ankle DF AROM at least 5 deg or greater to improve toe clearance for gait and for decreased gastroc tightness and improved ankle mobility to decrease pain with standing and walking. 12/03/20: pt walks c an min antalgic gait. There is ealry heel off  and a medial heel whip at end of stance related to PF/heel cord tightness 01/23/21: R ankle DF to 3d with gait deficits remaining re: early heel off, medial heel whip and decreased gait toerance.    Baseline 0 deg    Status Not Met    Target Date 01/23/21      PT LONG TERM GOAL #4   Title Tolerate standing as needed for work shifts with heel pain <3/10 at worst. 12/03/20: Baseline pain is 5/10. Pt has not been able to return to work. pt continues to experince significant R ankle/foot pain c prolonged walking    Baseline 9-10/10 at worst with prolonged standing at work    Status Not Met      PT LONG TERM GOAL #5   Title Increase bilateral ankle strength to grossly 5/5 to improve ankle stability for outdoor ambulation over uneven surfaces. 12/03/20: NT. 01/23/21: R ankle =5/5. Strength improved, but pain limited gait distance and uneven surfaces increased pt's pain.    Status Not Met                   Plan - 01/24/21 0739     Clinical Impression Statement Pt has participated in PT for 16 visits to address pain, mobility, and strength of the R foot and ankle. Over the course of care, the pt's R ankle ROM has not significantly improved and gait deficits associated with decreased ankle DF persist. Pt's R ankle foot pain has temporarily decreased or has been at a low level with decreased activity, but when the pt increases his walking distance or intensity of activity, the pain increases. The R ankle strength has increased with the pt participating in resistive, functional, and balance exs with pain management. Overall, the pt's pain and function have not significantly improved with PT intervention and he is to return to his referring physician to determine future course of care.    Personal Factors and Comorbidities Profession    Examination-Activity Limitations Lift;Locomotion Level;Stand    Examination-Participation Restrictions Occupation;Community Activity    Stability/Clinical Decision  Making Evolving/Moderate complexity    Clinical Decision Making Moderate    Rehab Potential Good    PT Frequency 2x / week    PT Duration 6 weeks    PT Treatment/Interventions ADLs/Self Care Home Management;Cryotherapy;Electrical Stimulation;Iontophoresis 4mg /ml Dexamethasone;Moist Heat;Functional mobility training;Therapeutic activities;Gait training;Therapeutic exercise;Patient/family education;Manual techniques;Neuromuscular  re-education;Taping;Dry needling;Passive range of motion;Aquatic Therapy    PT Home Exercise Plan Access code: [email or web address]    Consulted and Agree with Plan of Care Patient             Patient will benefit from skilled therapeutic intervention in order to improve the following deficits and impairments:  Pain, Impaired flexibility, Increased fascial restricitons, Decreased strength, Decreased activity tolerance, Decreased range of motion, Difficulty walking, Hypomobility  Visit Diagnosis: Pain in right foot  Difficulty in walking, not elsewhere classified  Muscle weakness (generalized)  Other abnormalities of gait and mobility  Pain in left foot     Problem List Patient Active Problem List   Diagnosis Date Noted   Hemorrhoid 09/17/2016   Cluster headaches 10/13/2013   PHYSICAL THERAPY DISCHARGE SUMMARY  Visits from Start of Care: 16  Current functional level related to goals / functional outcomes: See above   Remaining deficits: See above   Education / Equipment: HEP  Patient agrees to discharge. Patient goals were partially met. Patient is being discharged due to did not respond to therapy.    Gar Ponto MS, PT 01/24/21 10:04 PM   [facility] [address] [place], [place], [address] Phone: [phone]   Fax:  [phone]  Name: Miklo Aken MRN: [id number] Date of Birth: 10/19/1980

## 2021-01-30 ENCOUNTER — Other Ambulatory Visit: Payer: Self-pay | Admitting: Podiatry

## 2021-01-30 ENCOUNTER — Telehealth: Payer: Self-pay | Admitting: *Deleted

## 2021-01-30 MED ORDER — CYCLOBENZAPRINE HCL 10 MG PO TABS: 10.0000 mg | ORAL_TABLET | Freq: Three times a day (TID) | ORAL | 0 refills | Status: DC | PRN

## 2021-01-30 MED ORDER — TRAMADOL HCL 50 MG PO TABS: 50.0000 mg | ORAL_TABLET | Freq: Three times a day (TID) | ORAL | 0 refills | Status: AC | PRN

## 2021-01-30 NOTE — Telephone Encounter (Signed)
Patient is calling to request medication refill(Cylobenzaprine-10 mg and thinks the other one was Tramadol).Please advise.

## 2021-01-31 NOTE — Telephone Encounter (Signed)
Returned call to patient and informed that refill has been sent to pharmacy on file.

## 2021-02-14 ENCOUNTER — Other Ambulatory Visit: Payer: Self-pay

## 2021-02-14 ENCOUNTER — Encounter: Payer: Self-pay | Admitting: Podiatry

## 2021-02-14 ENCOUNTER — Ambulatory Visit (INDEPENDENT_AMBULATORY_CARE_PROVIDER_SITE_OTHER): Payer: No Typology Code available for payment source | Admitting: Podiatry

## 2021-02-14 DIAGNOSIS — Q6689 Other  specified congenital deformities of feet: Secondary | ICD-10-CM

## 2021-02-14 DIAGNOSIS — M7751 Other enthesopathy of right foot: Secondary | ICD-10-CM

## 2021-02-14 MED ORDER — IBUPROFEN 800 MG PO TABS: 800.0000 mg | ORAL_TABLET | Freq: Three times a day (TID) | ORAL | 0 refills | Status: DC | PRN

## 2021-02-18 NOTE — Progress Notes (Signed)
Subjective: 40 year old male presents the office today for concerns of injury that happened at work on November 04, 2020 while getting on the elevator.  Prior to the injury he states that the foot was feeling much better and he is able to walk.  He was discharged in physical therapy as he is plateaued.  He has been trying to work out with a Psychologist, educational.  He is doing squats with 30 pounds in each hand.  He is able to do this for the next day he is sore.  Presents today with his caseworker from [employer]   Objective: AAO x3, NAD DP/PT pulses palpable bilaterally, CRT less than 3 seconds Majority tenderness appears to be the lateral aspect of the foot on the sinus tarsi but also today.  There is no area of pinpoint tenderness.  That this restriction of subtalar joint range of motion but does not change.  There is no other areas of pinpoint tenderness identified today.  Trace edema there is no erythema or warmth. MMT 5/5.  No pain with calf compression, swelling, warmth, erythema Overall exam unchanged  Assessment: Capsulitis right foot secondary to injury; plan fasciitis  Plan: -All treatment options discussed with the patient including all alternatives, risks, complications.  -He is maxed out physical therapy.  He has been doing training.  Discussed home balance exercises as well and a prescription for balance board was given and precautions were advised on when to use this.  I discussed muscle conservative intervention including resection of the coalition again.  All this was asymptomatic prior to the injury I do think that the injury did cause the reactivation of the pain.  Consider his options for this.  Return in about 4 weeks (around 03/14/2021).  Vivi Barrack DPM

## 2021-03-18 ENCOUNTER — Ambulatory Visit (INDEPENDENT_AMBULATORY_CARE_PROVIDER_SITE_OTHER): Payer: No Typology Code available for payment source | Admitting: Podiatry

## 2021-03-18 ENCOUNTER — Encounter: Payer: Self-pay | Admitting: Podiatry

## 2021-03-18 ENCOUNTER — Other Ambulatory Visit: Payer: Self-pay

## 2021-03-18 DIAGNOSIS — Q6689 Other  specified congenital deformities of feet: Secondary | ICD-10-CM

## 2021-03-18 DIAGNOSIS — M7751 Other enthesopathy of right foot: Secondary | ICD-10-CM | POA: Diagnosis not present

## 2021-03-25 NOTE — Progress Notes (Signed)
Subjective: 40 year old male presents the office today for concerns of injury that happened at work on November 04, 2020 while getting on the elevator.  Prior to the injury he states that the foot was feeling much better and he is able to walk.  He was discharged from physical therapy as he plateaued.  Since I last saw him he states the foot is feeling better.  Is walking up to a mile a day 3-4 times a week.  He still works out with a Psychologist, educational now working on Fridays.  He still some cramping sensations in the muscle relaxers do help this.  Ibuprofen has not been overly beneficial.  He does feel that overall the foot is improved and he does not want to proceed with any surgery at this point.   Objective: AAO x3, NAD DP/PT pulses palpable bilaterally, CRT less than 3 seconds Today on exam the symptoms are much improved.  There is no significant tenderness to palpation of the sinus tarsi.  Subtalar range of motion still somewhat restricted but appears to be somewhat improved today actually.  There is minimal to trace edema.  There is no area of pinpoint tenderness.  Flexor, extensor tendons appear to be intact.  MMT 5/5.  Assessment: Capsulitis right foot secondary to injury  Plan: -All treatment options discussed with the patient including all alternatives, risks, complications.  -Overall has been making good progress.  We did skin discussion modifications and wearing the orthotics.  I do think I did make a progress in last several weeks that he will remember work for 1 more week and at that point I would recommend returning to work for 8-hour days.  Vivi Barrack DPM

## 2021-04-15 ENCOUNTER — Ambulatory Visit: Payer: Self-pay | Admitting: Podiatry

## 2021-04-17 ENCOUNTER — Other Ambulatory Visit: Payer: Self-pay

## 2021-04-17 ENCOUNTER — Encounter: Payer: Self-pay | Admitting: Podiatry

## 2021-04-17 ENCOUNTER — Ambulatory Visit (INDEPENDENT_AMBULATORY_CARE_PROVIDER_SITE_OTHER): Payer: No Typology Code available for payment source | Admitting: Podiatry

## 2021-04-17 DIAGNOSIS — Q6689 Other  specified congenital deformities of feet: Secondary | ICD-10-CM

## 2021-04-17 DIAGNOSIS — M7751 Other enthesopathy of right foot: Secondary | ICD-10-CM

## 2021-04-17 MED ORDER — IBUPROFEN 800 MG PO TABS: 800.0000 mg | ORAL_TABLET | Freq: Three times a day (TID) | ORAL | 0 refills | Status: DC | PRN

## 2021-04-22 NOTE — Telephone Encounter (Signed)
error 

## 2021-04-23 NOTE — Progress Notes (Signed)
Subjective: 40 year old male presents the office today for concerns of injury that happened at work on November 04, 2020 while getting on the elevator.  Prior to the injury he states that the foot was feeling much better and he is able to walk.  He was discharged from physical therapy as he plateaued.  Since I last saw him he states the foot is feeling better.  He has increased his walking.  Gets some cramping sensations to his feet.  No recent injury or trauma.  No significant increase in swelling.  Has been walking about 2 miles a day.  No other concerns.  Objective: AAO x3, NAD DP/PT pulses palpable bilaterally, CRT less than 3 seconds Today on exam the symptoms continue to improve.  Does get some discomfort along the sinus tarsi but no significant area of pinpoint tenderness.  There is no edema, erythema.  Ankle, subtalar range of motion intact.  MMT 5/5.  Assessment: Capsulitis right foot secondary to injury  Plan: -All treatment options discussed with the patient including all alternatives, risks, complications.  -He is continue to make good progress. Plan on returning to work working about 8 hours a day 5 days a week.  He can sit for 15 minutes every 2 hours to allow ice and elevate.  He can wear ankle brace as needed.  Return in about 6 weeks (around 05/29/2021).  Vivi Barrack DPM

## 2021-05-29 ENCOUNTER — Other Ambulatory Visit: Payer: Self-pay

## 2021-05-29 ENCOUNTER — Encounter: Payer: Self-pay | Admitting: Podiatry

## 2021-05-29 ENCOUNTER — Ambulatory Visit (INDEPENDENT_AMBULATORY_CARE_PROVIDER_SITE_OTHER): Payer: No Typology Code available for payment source | Admitting: Podiatry

## 2021-05-29 DIAGNOSIS — M7751 Other enthesopathy of right foot: Secondary | ICD-10-CM | POA: Diagnosis not present

## 2021-05-29 DIAGNOSIS — Q6689 Other  specified congenital deformities of feet: Secondary | ICD-10-CM | POA: Diagnosis not present

## 2021-05-29 NOTE — Patient Instructions (Signed)
You can try "theraworx" on the foot/legs as well at night

## 2021-06-01 NOTE — Progress Notes (Signed)
Subjective: 40 year old male presents the office today for concerns of injury that happened at work on November 04, 2020 while getting on the elevator.  Prior to the injury he states that the foot was feeling much better and he is able to walk.  He was discharged from physical therapy as he plateaued.  Since I last saw him he states the foot is feeling better.  He states that the beginning of the week his pain level is 3/10 around mid week it is about a 4-5/10.  He still gets muscle spasms he takes muscle relaxers at nighttime.  Most of discomfort is when he sits at nighttime.  When he puts weight on his foot or walks it feels better.  No recent injury.  He still working 8 hours a day.  Objective: AAO x3, NAD DP/PT pulses palpable bilaterally, CRT less than 3 seconds Today on exam the symptoms continue to improve.  There is still tenderness palpation on the sinus tarsi with a minimally.  There is no area of pinpoint tenderness.  Flexor, extensor tendons appear to be intact.  MMT 5/5.   Assessment: Capsulitis right foot secondary to injury  Plan: -All treatment options discussed with the patient including all alternatives, risks, complications.  -He is continue to make good progress.  However at this point I would not have him remain working on his limited hours and I will see him back.  Continue shoes, good arch support.  Discussed other topical medications that he can use to help with cramping such as Theraworx or other medications such as this.  Return in about 6 weeks (around 07/10/2021).  Vivi Barrack DPM

## 2021-06-10 ENCOUNTER — Telehealth: Payer: Self-pay | Admitting: *Deleted

## 2021-06-10 NOTE — Telephone Encounter (Signed)
Patient is calling for a refill on his medication for muscle spasms, ibuprofen and he is requesting a handicap placard.Please advise.

## 2021-06-11 ENCOUNTER — Other Ambulatory Visit: Payer: Self-pay | Admitting: Podiatry

## 2021-06-11 MED ORDER — IBUPROFEN 800 MG PO TABS: 800.0000 mg | ORAL_TABLET | Freq: Three times a day (TID) | ORAL | 0 refills | Status: DC | PRN

## 2021-06-11 MED ORDER — CYCLOBENZAPRINE HCL 10 MG PO TABS: 10.0000 mg | ORAL_TABLET | Freq: Three times a day (TID) | ORAL | 0 refills | Status: DC | PRN

## 2021-06-11 NOTE — Telephone Encounter (Signed)
Returned the call to patient, no answer, left vmessage that his medication request has been and sent to pharmacy and that a 3 month handicap placard has been approved.

## 2021-06-21 ENCOUNTER — Encounter: Payer: Self-pay | Admitting: Podiatry

## 2021-06-23 NOTE — Telephone Encounter (Signed)
Please advise 

## 2021-06-25 ENCOUNTER — Telehealth: Payer: Self-pay | Admitting: *Deleted

## 2021-06-25 NOTE — Telephone Encounter (Signed)
Called and spoke with the patient and relayed that the employer has selected home delivery as their preferred option for filling long term worker's comp prescriptions. Michael Ramirez

## 2021-07-10 ENCOUNTER — Encounter (HOSPITAL_COMMUNITY): Payer: Self-pay | Admitting: Pharmacy Technician

## 2021-07-10 ENCOUNTER — Emergency Department (HOSPITAL_COMMUNITY): Payer: Self-pay

## 2021-07-10 ENCOUNTER — Emergency Department (HOSPITAL_COMMUNITY)
Admission: EM | Admit: 2021-07-10 | Discharge: 2021-07-10 | Disposition: A | Discharge status: Home / Self Care | Payer: Self-pay | Attending: Emergency Medicine | Admitting: Emergency Medicine

## 2021-07-10 ENCOUNTER — Emergency Department (HOSPITAL_BASED_OUTPATIENT_CLINIC_OR_DEPARTMENT_OTHER): Payer: Self-pay

## 2021-07-10 ENCOUNTER — Ambulatory Visit: Payer: No Typology Code available for payment source | Admitting: Podiatry

## 2021-07-10 ENCOUNTER — Other Ambulatory Visit: Payer: Self-pay

## 2021-07-10 DIAGNOSIS — R079 Chest pain, unspecified: Secondary | ICD-10-CM | POA: Insufficient documentation

## 2021-07-10 DIAGNOSIS — Z20822 Contact with and (suspected) exposure to covid-19: Secondary | ICD-10-CM | POA: Insufficient documentation

## 2021-07-10 DIAGNOSIS — M79604 Pain in right leg: Secondary | ICD-10-CM

## 2021-07-10 DIAGNOSIS — R0602 Shortness of breath: Secondary | ICD-10-CM | POA: Insufficient documentation

## 2021-07-10 DIAGNOSIS — Z87891 Personal history of nicotine dependence: Secondary | ICD-10-CM | POA: Insufficient documentation

## 2021-07-10 LAB — CBC WITH DIFFERENTIAL/PLATELET
Abs Immature Granulocytes: 0.01 10*3/uL (ref 0.00–0.07)
Basophils Absolute: 0 10*3/uL (ref 0.0–0.1)
Basophils Relative: 0 %
Eosinophils Absolute: 0.1 10*3/uL (ref 0.0–0.5)
Eosinophils Relative: 2 %
HCT: 51 % (ref 39.0–52.0)
Hemoglobin: 16.8 g/dL (ref 13.0–17.0)
Immature Granulocytes: 0 %
Lymphocytes Relative: 38 %
Lymphs Abs: 1.8 10*3/uL (ref 0.7–4.0)
MCH: 26.6 pg (ref 26.0–34.0)
MCHC: 32.9 g/dL (ref 30.0–36.0)
MCV: 80.8 fL (ref 80.0–100.0)
Monocytes Absolute: 0.5 10*3/uL (ref 0.1–1.0)
Monocytes Relative: 11 %
Neutro Abs: 2.4 10*3/uL (ref 1.7–7.7)
Neutrophils Relative %: 49 %
Platelets: 271 10*3/uL (ref 150–400)
RBC: 6.31 MIL/uL — ABNORMAL HIGH (ref 4.22–5.81)
RDW: 13.4 % (ref 11.5–15.5)
WBC: 4.9 10*3/uL (ref 4.0–10.5)
nRBC: 0 % (ref 0.0–0.2)

## 2021-07-10 LAB — COMPREHENSIVE METABOLIC PANEL
ALT: 24 U/L (ref 0–44)
AST: 21 U/L (ref 15–41)
Albumin: 4.1 g/dL (ref 3.5–5.0)
Alkaline Phosphatase: 59 U/L (ref 38–126)
Anion gap: 8 (ref 5–15)
BUN: 11 mg/dL (ref 6–20)
CO2: 26 mmol/L (ref 22–32)
Calcium: 9.3 mg/dL (ref 8.9–10.3)
Chloride: 103 mmol/L (ref 98–111)
Creatinine, Ser: 1.51 mg/dL — ABNORMAL HIGH (ref 0.61–1.24)
GFR, Estimated: 60 mL/min — ABNORMAL LOW (ref >60)
Glucose, Bld: 88 mg/dL (ref 70–99)
Potassium: 3.9 mmol/L (ref 3.5–5.1)
Sodium: 137 mmol/L (ref 135–145)
Total Bilirubin: 0.9 mg/dL (ref 0.3–1.2)
Total Protein: 7 g/dL (ref 6.5–8.1)

## 2021-07-10 LAB — RESP PANEL BY RT-PCR (FLU A&B, COVID) ARPGX2
Influenza A by PCR: NEGATIVE
Influenza B by PCR: NEGATIVE
SARS Coronavirus 2 by RT PCR: NEGATIVE

## 2021-07-10 LAB — TROPONIN I (HIGH SENSITIVITY): Troponin I (High Sensitivity): 4 ng/L (ref <18)

## 2021-07-10 LAB — LACTIC ACID, PLASMA: Lactic Acid, Venous: 0.8 mmol/L (ref 0.5–1.9)

## 2021-07-10 NOTE — ED Provider Notes (Signed)
Emergency Medicine Provider Triage Evaluation Note  Michael Ramirez , a 40 y.o. male  was evaluated in triage.  Pt complains of shortness of breath.  She reports that this started about a week ago.  No leg swelling, no medication changes.  He reports that he has no cough or fever.  He feels like there is something poking him in the left sided chest.  This feeling started at the same time.  He reports no drug use.    He has been dealing with a right leg injury.  With that he has been on light duty.  He has had calf pain in that right leg.  He denies any hemoptysis.  No history of DVT/PE.  Review of Systems  Positive:  Negative: See above  Physical Exam  BP (!) 144/86 (BP Location: Right Arm)    Pulse 61    Temp 98.2 F (36.8 C)    Resp 18    SpO2 99%  Gen:   Awake, no distress   Resp:  Normal effort  MSK:   Moves extremities without difficulty  Other:  2+ DP pulse right leg.  Right calf is tender to palpation.  Lungs are clear to auscultation bilaterally.  Medical Decision Making  Medically screening exam initiated at 1:30 PM.  Appropriate orders placed.  Alvin Critchley was informed that the remainder of the evaluation will be completed by another provider, this initial triage assessment does not replace that evaluation, and the importance of remaining in the ED until their evaluation is complete.  Patient with chest pain.  He does not have any ILA or URI-like symptoms to explain his chest pain or shortness of breath.  He does have a ongoing issue with his right leg and has right calf pain increasing suspicion for DVT/PE.  Will obtain ultrasound to evaluate for right lower extremity DVT.  Additionally will check blood work, chest x-ray, and EKG.   Cristina Gong, [place] 07/10/21 1343    Tegeler, Canary Brim, MD 07/10/21 [phone]

## 2021-07-10 NOTE — Discharge Instructions (Signed)
Your work-up was reassuring today.  Follow-up with your doctor as needed. 

## 2021-07-10 NOTE — ED Triage Notes (Signed)
Pt here with shob X1 week. Pt denies chest pain, cough fever. Speaking in complete sentences in NAD.

## 2021-07-10 NOTE — ED Notes (Signed)
ED Provider at bedside. 

## 2021-07-10 NOTE — Progress Notes (Signed)
Lower extremity venous has been completed.   Preliminary results in CV Proc.   Aundra Millet Juliano Mceachin 07/10/2021 2:49 PM

## 2021-07-10 NOTE — ED Provider Notes (Signed)
[facility] EMERGENCY DEPARTMENT Provider Note   CSN: [id number] Arrival date & time: 07/10/21  1144     History Chief Complaint  Patient presents with   Shortness of Breath    Michael Ramirez is a 40 y.o. male.   Shortness of Breath Associated symptoms: chest pain   Associated symptoms: no abdominal pain and no rash   Patient presents with shortness of breath and chest pain.  Is had over the last few weeks.  Has a specific point on his left anterior chest that will hurt.  States it will be a small stabbing pain.  Comes and goes.  Not exertional.  Will last for little while and go away.  In between episodes he will feel back to normal.  Also states he is more short of breath at times.  States he has had more trouble with some exertion.  States he has not been exerting himself as much however due to some foot issue that he has been having on the right side.  No fevers or chills.  No coughing.  No real swelling in the leg.  Patient is worried that the chest pain could give him a stroke.    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 Right 02/28/2020       Family History  Problem Relation Age of Onset   Hypertension Father    Anxiety disorder Father    Heart disease Paternal Grandmother    Cancer Paternal Grandfather     Social History   Tobacco Use   Smoking status: Former    Types: Cigarettes    Quit date: 11/25/2015    Years since quitting: 5.6   Smokeless tobacco: Former  Building services engineer Use: Never used  Substance Use Topics   Alcohol use: Yes    Alcohol/week: 6.0 standard drinks    Types: 6 Shots of liquor per week    Comment: Fifth  every other weekend    Drug use: Yes    Types:  Marijuana    Home Medications Prior to Admission medications   Medication Sig Start Date End Date Taking? Authorizing Provider  Ascorbic Acid (VITAMIN C) 500 MG CHEW Chew 1 tablet by mouth daily.   Yes [provider]  Cyanocobalamin (VITAMIN B-12) 1000 MCG SUBL Place 1 tablet under the tongue 2 (two) times a week.   Yes [provider]  cyclobenzaprine (FLEXERIL) 10 MG tablet Take 1 tablet (10 mg total) by mouth 3 (three) times daily as needed for muscle spasms. 06/11/21  Yes Michael [facility], DPM  ibuprofen (ADVIL) 800 MG tablet Take 1 tablet (800 mg total) by mouth every 8 (eight) hours as needed. Patient taking differently: Take 800 mg by mouth every 8 (eight) hours as needed for moderate pain. 06/11/21  Yes Michael [facility], DPM  diclofenac Sodium (VOLTAREN) 1 % GEL Apply 2 g topically 4 (four) times daily. Patient not taking: Reported on 07/10/2021 11/07/20   Michael Pates, PA-C    Allergies    Oxycodone  Review of Systems   Review of Systems  Constitutional:  Negative for appetite change.  HENT:  Negative for congestion.   Respiratory:  Positive for shortness of breath.  Cardiovascular:  Positive for chest pain. Negative for palpitations and leg swelling.  Gastrointestinal:  Negative for abdominal pain.  Genitourinary:  Negative for flank pain.  Musculoskeletal:  Negative for back pain.  Skin:  Negative for rash.  Neurological:  Negative for weakness.  Psychiatric/Behavioral:  Negative for confusion.    Physical Exam Updated Vital Signs BP (!) 141/82    Pulse 66    Temp 98.2 F (36.8 C)    Resp 18    SpO2 99%   Physical Exam Vitals and nursing note reviewed.  HENT:     Head: Normocephalic.  Cardiovascular:     Rate and Rhythm: Normal rate and regular rhythm.  Pulmonary:     Breath sounds: Normal breath sounds. No wheezing, rhonchi or rales.  Chest:     Chest wall: No tenderness.  Abdominal:     Tenderness: There is no abdominal tenderness.   Musculoskeletal:     Comments: Trace edema bilateral lower extremities.  Skin:    General: Skin is warm.     Capillary Refill: Capillary refill takes less than 2 seconds.  Neurological:     Mental Status: He is alert.    ED Results / Procedures / Treatments   Labs (all labs ordered are listed, but only abnormal results are displayed) Labs Reviewed  CBC WITH DIFFERENTIAL/PLATELET - Abnormal; Notable for the following components:      Result Value   RBC 6.31 (*)    All other components within normal limits  COMPREHENSIVE METABOLIC PANEL - Abnormal; Notable for the following components:   Creatinine, Ser 1.51 (*)    GFR, Estimated 60 (*)    All other components within normal limits  RESP PANEL BY RT-PCR (FLU A&B, COVID) ARPGX2  LACTIC ACID, PLASMA  LACTIC ACID, PLASMA  TROPONIN I (HIGH SENSITIVITY)  TROPONIN I (HIGH SENSITIVITY)    EKG EKG Interpretation  Date/Time:  Thursday July 10 2021 13:53:32 EST Ventricular Rate:  70 PR Interval:  152 QRS Duration: 82 QT Interval:  382 QTC Calculation: 412 R Axis:   -38 Text Interpretation: Normal sinus rhythm with sinus arrhythmia Left axis deviation Moderate voltage criteria for LVH, may be normal variant ( R in aVL , Cornell product ) No old tracing to compare Confirmed by Michael Ramirez [phone]) on 07/10/2021 6:20:45 PM  Radiology DG Chest 2 View  Result Date: 07/10/2021 CLINICAL DATA:  Shortness of breath. EXAM: CHEST - 2 VIEW COMPARISON:  January 04, 2005. FINDINGS: The heart size and mediastinal contours are within normal limits. Both lungs are clear. The visualized skeletal structures are unremarkable. IMPRESSION: No active cardiopulmonary disease. Electronically Signed   By: Lupita Raider M.D.   On: 07/10/2021 14:37   VAS [place] LOWER EXTREMITY VENOUS (DVT) (ONLY [facility] & [facility])  Result Date: 07/10/2021  Lower Venous DVT Study Patient Name:  Michael Ramirez  Date of Exam:   07/10/2021 Medical Rec #: [id number]             Accession #:    [id number] Date of Birth: [birth year]             Patient Gender: M Patient Age:   65 years Exam Location:  [facility] Procedure:      VAS [place] LOWER EXTREMITY VENOUS (DVT) Referring Phys: Michael Ramirez --------------------------------------------------------------------------------  Indications: Pain.  Comparison Study: 12/10/20 prior Performing Technologist: [place] Ponder RVS  Examination Guidelines: A complete evaluation includes B-mode imaging, spectral Doppler, color Doppler, and power Doppler as needed of  all accessible portions of each vessel. Bilateral testing is considered an integral part of a complete examination. Limited examinations for reoccurring indications may be performed as noted. The reflux portion of the exam is performed with the patient in reverse Trendelenburg.  +---------+---------------+---------+-----------+----------+--------------+  RIGHT     Compressibility Phasicity Spontaneity Properties Thrombus Aging  +---------+---------------+---------+-----------+----------+--------------+  CFV       Full            Yes       Yes                                    +---------+---------------+---------+-----------+----------+--------------+  SFJ       Full                                                             +---------+---------------+---------+-----------+----------+--------------+  FV Prox   Full                                                             +---------+---------------+---------+-----------+----------+--------------+  FV Mid    Full                                                             +---------+---------------+---------+-----------+----------+--------------+  FV Distal Full                                                             +---------+---------------+---------+-----------+----------+--------------+  PFV       Full                                                              +---------+---------------+---------+-----------+----------+--------------+  POP       Full            Yes       Yes                                    +---------+---------------+---------+-----------+----------+--------------+  PTV       Full                                                             +---------+---------------+---------+-----------+----------+--------------+  PERO  Full                                                             +---------+---------------+---------+-----------+----------+--------------+   +----+---------------+---------+-----------+----------+--------------+  LEFT Compressibility Phasicity Spontaneity Properties Thrombus Aging  +----+---------------+---------+-----------+----------+--------------+  CFV  Full            Yes       Yes                                    +----+---------------+---------+-----------+----------+--------------+     Summary: RIGHT: - There is no evidence of deep vein thrombosis in the lower extremity.  - No cystic structure found in the popliteal fossa.  LEFT: - No evidence of common femoral vein obstruction.  *See table(s) above for measurements and observations.    Preliminary     Procedures Procedures   Medications Ordered in ED Medications - No data to display  ED Course  I have reviewed the triage vital signs and the nursing notes.  Pertinent labs & imaging results that were available during my care of the patient were reviewed by me and considered in my medical decision making (see chart for details).    MDM Rules/Calculators/A&P                         Patient with chest pain.  Sharp.  Comes and goes.  Not exertional.  Feels like a tiny little pinch on his left chest.  EKG reassuring.  Troponin negative.  Chest x-ray reassuring.  Negative Doppler right lower extremity.  Has had some pain in that leg but not really having much edema.  Unclear cause of pain.  Doubt pulm embolism.  Doubt cardiac cause.  Appears stable for discharge  home with PCP follow-up    Final Clinical Impression(s) / ED Diagnoses Final diagnoses:  Shortness of breath  Chest pain, unspecified type    Rx / DC Orders ED Discharge Orders     None        Michael Core, MD 07/10/21 [phone]

## 2021-07-11 ENCOUNTER — Ambulatory Visit: Payer: Self-pay | Admitting: Family Medicine

## 2021-07-14 ENCOUNTER — Ambulatory Visit (INDEPENDENT_AMBULATORY_CARE_PROVIDER_SITE_OTHER): Payer: Self-pay | Admitting: Family Medicine

## 2021-07-14 VITALS — BP 128/96 | HR 80 | Temp 98.5°F | Wt 162.4 lb

## 2021-07-14 DIAGNOSIS — R030 Elevated blood-pressure reading, without diagnosis of hypertension: Secondary | ICD-10-CM

## 2021-07-14 DIAGNOSIS — F411 Generalized anxiety disorder: Secondary | ICD-10-CM

## 2021-07-14 DIAGNOSIS — F322 Major depressive disorder, single episode, severe without psychotic features: Secondary | ICD-10-CM

## 2021-07-14 MED ORDER — SERTRALINE HCL 25 MG PO TABS: 25.0000 mg | ORAL_TABLET | Freq: Every day | ORAL | 2 refills | Status: DC

## 2021-07-14 NOTE — Progress Notes (Signed)
Subjective:    Patient ID: Michael Ramirez, male    DOB: 01-10-1981, 40 y.o.   MRN: [id number]  Chief Complaint  Patient presents with   Chest Pain    Had some discomfort in left side of chest, sometimes feels it on both sides. Today was in the center of chest, felt like he had worked out. Tried breathing slowly, helped for about 10 mins, then felt the discomfort again.     HPI Patient was seen today for f/u on ongoing issue.  Pt seen in ED 07/10/21 for intermittent SOB/CP x wks.  EKG, Trpn, and CXR negative.  Doppler of RLE negative.  Pt advised to f/u outpt.  Pt notes the sensation really started after a workman's comp injury where he re-injured his R foot in April 2022.  Pt endorses mind racing, a soreness, poke, or pulsing in L chest.  Sensation can occur at any time.  May happen while sitting at work.  Patient notes intermittent elevation in BP and pulse: 133/92, 103/80, 127/82 with pulse 101 and 90.  Patient notes increased stress/anxiety which are affecting his daily life.  Pt's his father has h/o anxiety and HTN.  Past Medical History:  Diagnosis Date   Cluster headache     Allergies  Allergen Reactions   Oxycodone Rash    ROS General: Denies fever, chills, night sweats, changes in weight, changes in appetite HEENT: Denies headaches, ear pain, changes in vision, rhinorrhea, sore throat CV: Denies CP, palpitations, SOB, orthopnea +SOB, chest soreness Pulm: Denies SOB, cough, wheezing GI: Denies abdominal pain, nausea, vomiting, diarrhea, constipation GU: Denies dysuria, hematuria, frequency, vaginal discharge Msk: Denies muscle cramps, joint pains Neuro: Denies weakness, numbness, tingling Skin: Denies rashes, bruising Psych: Denies depression, hallucinations +anxiety, stress, mind racing    Objective:    Blood pressure (!) 128/96, pulse 80, temperature 98.5 F (36.9 C), temperature source Oral, weight 162 lb 6.4 oz (73.7 kg), SpO2 97 %.  Gen. Pleasant, well-nourished,  in no distress, anxious HEENT: [place]/AT, face symmetric, conjunctiva clear, no scleral icterus, PERRLA, EOMI, nares patent without drainage Neck: No JVD, no thyromegaly, no carotid bruits Lungs: no accessory muscle use, CTAB, no wheezes or rales Cardiovascular: RRR, no m/r/g, no peripheral edema Musculoskeletal: No deformities, no cyanosis or clubbing, normal tone Neuro:  A&Ox3, CN II-XII intact, normal gait Skin:  Warm, no lesions/ rash   Wt Readings from Last 3 Encounters:  07/14/21 162 lb 6.4 oz (73.7 kg)  11/10/20 174 lb (78.9 kg)  08/21/20 180 lb (81.6 kg)    Lab Results  Component Value Date   WBC 4.9 07/10/2021   HGB 16.8 07/10/2021   HCT 51.0 07/10/2021   PLT 271 07/10/2021   GLUCOSE 88 07/10/2021   CHOL 249 (H) 10/25/2019   TRIG 133.0 10/25/2019   HDL 45.70 10/25/2019   LDLDIRECT 155.0 11/01/2017   LDLCALC 177 (H) 10/25/2019   ALT 24 07/10/2021   AST 21 07/10/2021   NA 137 07/10/2021   K 3.9 07/10/2021   CL 103 07/10/2021   CREATININE 1.51 (H) 07/10/2021   BUN 11 07/10/2021   CO2 26 07/10/2021   TSH 0.96 10/25/2019   HGBA1C 5.9 10/25/2019   Depression screen PHQ 2/9 07/14/2021  Decreased Interest 2  Down, Depressed, Hopeless 2  PHQ - 2 Score 4  Altered sleeping 3  Tired, decreased energy 2  Change in appetite 3  Feeling bad or failure about yourself  2  Trouble concentrating 3  Moving slowly or  fidgety/restless 1  Suicidal thoughts 0  PHQ-9 Score 18  Difficult doing work/chores Very difficult   GAD 7 : Generalized Anxiety Score 07/14/2021  Nervous, Anxious, on Edge 3  Control/stop worrying 3  Worry too much - different things 3  Trouble relaxing 3  Restless 1  Easily annoyed or irritable 2  Afraid - awful might happen 3  Total GAD 7 Score 18  Anxiety Difficulty Very difficult      Assessment/Plan:  GAD (generalized anxiety disorder)  -GAD-7 score 18 this visit -Discussed various treatment options -We will start Zoloft 25 mg daily -Patient  also given information regarding counseling.  Encouraged to schedule an appointment. -Given precautions - Plan: sertraline (ZOLOFT) 25 MG tablet  Depression, major, single episode, severe (HCC) -PHQ-9 score 18 this visit -Discussed various treatment options -We will start Zoloft 25 mg -Given information regarding counseling/BH providers.  Patient encouraged to schedule an appointment -Given strict precautions  - Plan: sertraline (ZOLOFT) 25 MG tablet  Elevated blood pressure reading without diagnosis of hypertension -Elevation possibly 2/2 increased stress/anxiety -Discussed the importance of lifestyle modifications -We will have patient monitor BP at home and keep a log to bring with him to clinic -For continued elevation start medication  F/u 4-6 weeks, sooner if needed  Grier Mitts, MD

## 2021-07-14 NOTE — Patient Instructions (Signed)
Behavioral Health Services: -to make an appointment contact the office/provider you are interested in seeing.  No referral is needed.  The below is not an all inclusive list, but will help you get started.  [email or web address] -counseling located off of [address]  [facility] -Located off of [address] across from [employer]  Dr. Akintayo is a Psychiatrist with [place]. [phone]  [facility]  [facility]  -[address]  [phone] -a place in town that has counseling and Psychiatry services.    

## 2021-07-23 ENCOUNTER — Telehealth: Payer: Self-pay | Admitting: Podiatry

## 2021-07-23 NOTE — Telephone Encounter (Signed)
Michael Ramirez said he missed his appt last month and needs to have his leave extended. Please advise,he is scheduled to return to work 07/27/2021.

## 2021-07-28 ENCOUNTER — Encounter: Payer: Self-pay | Admitting: Family Medicine

## 2021-08-05 ENCOUNTER — Other Ambulatory Visit: Payer: Self-pay | Admitting: Family Medicine

## 2021-08-05 DIAGNOSIS — F322 Major depressive disorder, single episode, severe without psychotic features: Secondary | ICD-10-CM

## 2021-08-05 DIAGNOSIS — F411 Generalized anxiety disorder: Secondary | ICD-10-CM

## 2021-08-11 ENCOUNTER — Ambulatory Visit (INDEPENDENT_AMBULATORY_CARE_PROVIDER_SITE_OTHER): Payer: No Typology Code available for payment source | Admitting: Podiatry

## 2021-08-11 ENCOUNTER — Other Ambulatory Visit: Payer: Self-pay

## 2021-08-11 DIAGNOSIS — M7751 Other enthesopathy of right foot: Secondary | ICD-10-CM | POA: Diagnosis not present

## 2021-08-11 DIAGNOSIS — Q6689 Other  specified congenital deformities of feet: Secondary | ICD-10-CM

## 2021-08-11 NOTE — Patient Instructions (Signed)

## 2021-08-14 NOTE — Progress Notes (Signed)
Subjective: 41 year old male presents the office today for concerns of injury that happened at work on November 04, 2020 while getting on the elevator.  Prior to the injury he states that the foot was feeling much better and he is able to walk.  He was discharged from physical therapy as he plateaued.  Since I last saw him he states the foot is is doing about the same he is continue have pain particularly after being on his feet for several days.  Asking for steroid injection he wants to consider surgical intervention and further discuss this today.  No recent injury or changes in the last saw him he has no new concerns.  Objective: AAO x3, NAD DP/PT pulses palpable bilaterally, CRT less than 3 seconds Overall his symptoms seem to have been worsening.  Continue to have discomfort mostly on sinus tarsi lateral area of the previous coalition excision.  There is minimal edema.  No significant pain on the course the peroneal tendons today there is no edema.  Flexor, extensor tendons appear to be intact. MMT 5/5.   Assessment: Capsulitis right foot secondary to injury; partial coalition  Plan: -All treatment options discussed with the patient including all alternatives, risks, complications.  -At this point the patient is attempted numerous conservative treatments including physical therapy, shoe modifications, orthotics, anti-inflammatories, injections without any significant resolution he continues pain on every day basis and worse as the day goes on.  At this time we discussed steroid injection which was performed.  See procedure note below.  Ultimately discussed surgical intervention to excise the tarsal coalition.  He wants to plan on doing this soon as possible. -The incision placement as well as the postoperative course was discussed with the patient. I discussed risks of the surgery which include, but not limited to, infection, bleeding, pain, swelling, need for further surgery, delayed or  nonhealing, painful or ugly scar, numbness or sensation changes, over/under correction, recurrence, transfer lesions, further deformity, arthritis, hardware failure, DVT/PE, loss of toe/foot. Patient understands these risks and wishes to proceed with surgery. The surgical consent was reviewed with the patient all 3 pages were signed. No promises or guarantees were given to the outcome of the procedure. All questions were answered to the best of my ability. Before the surgery the patient was encouraged to call the office if there is any further questions. The surgery will be performed at the [facility] on an outpatient basis.  Vivi Barrack DPM

## 2021-08-20 ENCOUNTER — Telehealth: Payer: Self-pay | Admitting: Podiatry

## 2021-08-20 NOTE — Telephone Encounter (Signed)
Michael Ramirez was unsure of how long, he will be out of work for surgery/recovery. Please advise?

## 2021-08-25 ENCOUNTER — Telehealth: Payer: Self-pay | Admitting: Urology

## 2021-08-25 ENCOUNTER — Ambulatory Visit: Payer: Self-pay | Admitting: Family Medicine

## 2021-08-25 NOTE — Telephone Encounter (Signed)
DOS - 08/27/21  REMOVAL TARSAL COALITION RIGHT --- [phone]  SPOKE WITH WC CASE WORKER ROSE AND SHE STATED THAT CPT CODE 09628 HAS BEEN APPROVED BY ADJUSTER.  ROSE PHONE # [phone]

## 2021-08-27 ENCOUNTER — Encounter: Payer: Self-pay | Admitting: Podiatry

## 2021-08-27 ENCOUNTER — Other Ambulatory Visit: Payer: Self-pay | Admitting: Podiatry

## 2021-08-27 DIAGNOSIS — M7751 Other enthesopathy of right foot: Secondary | ICD-10-CM | POA: Diagnosis not present

## 2021-08-27 DIAGNOSIS — Q6689 Other  specified congenital deformities of feet: Secondary | ICD-10-CM | POA: Diagnosis not present

## 2021-08-27 MED ORDER — CEPHALEXIN 500 MG PO CAPS: 500.0000 mg | ORAL_CAPSULE | Freq: Three times a day (TID) | ORAL | 0 refills | Status: DC

## 2021-08-27 MED ORDER — HYDROCODONE-ACETAMINOPHEN 10-325 MG PO TABS: 1.0000 | ORAL_TABLET | Freq: Four times a day (QID) | ORAL | 0 refills | Status: AC | PRN

## 2021-08-27 MED ORDER — PROMETHAZINE HCL 25 MG PO TABS: 25.0000 mg | ORAL_TABLET | Freq: Three times a day (TID) | ORAL | 0 refills | Status: DC | PRN

## 2021-08-27 NOTE — Telephone Encounter (Signed)
Angela- can you please take care of this? Thanks! °

## 2021-08-27 NOTE — Progress Notes (Signed)
Post-op mediations sent  °

## 2021-09-01 ENCOUNTER — Ambulatory Visit (INDEPENDENT_AMBULATORY_CARE_PROVIDER_SITE_OTHER): Payer: No Typology Code available for payment source | Admitting: Podiatry

## 2021-09-01 ENCOUNTER — Ambulatory Visit (INDEPENDENT_AMBULATORY_CARE_PROVIDER_SITE_OTHER): Payer: No Typology Code available for payment source

## 2021-09-01 ENCOUNTER — Other Ambulatory Visit: Payer: Self-pay

## 2021-09-01 DIAGNOSIS — Z9889 Other specified postprocedural states: Secondary | ICD-10-CM | POA: Diagnosis not present

## 2021-09-01 DIAGNOSIS — Q6689 Other  specified congenital deformities of feet: Secondary | ICD-10-CM

## 2021-09-02 ENCOUNTER — Telehealth: Payer: Self-pay | Admitting: *Deleted

## 2021-09-02 ENCOUNTER — Other Ambulatory Visit: Payer: Self-pay | Admitting: Podiatry

## 2021-09-02 MED ORDER — IBUPROFEN 800 MG PO TABS: 800.0000 mg | ORAL_TABLET | Freq: Three times a day (TID) | ORAL | 0 refills | Status: DC | PRN

## 2021-09-02 MED ORDER — HYDROCODONE-ACETAMINOPHEN 5-325 MG PO TABS: 1.0000 | ORAL_TABLET | ORAL | 0 refills | Status: DC | PRN

## 2021-09-02 NOTE — Telephone Encounter (Signed)
Patient is calling to request a refill of his ibuprofen, hydrocodone -ace. Please advise.

## 2021-09-03 ENCOUNTER — Other Ambulatory Visit: Payer: Self-pay | Admitting: Podiatry

## 2021-09-03 DIAGNOSIS — L2089 Other atopic dermatitis: Secondary | ICD-10-CM | POA: Diagnosis not present

## 2021-09-03 MED ORDER — HYDROCODONE-ACETAMINOPHEN 10-325 MG PO TABS
1.0000 | ORAL_TABLET | Freq: Four times a day (QID) | ORAL | 0 refills | Status: AC | PRN
Start: 2021-09-03 — End: 2021-09-08

## 2021-09-03 NOTE — Telephone Encounter (Signed)
Notified patient.

## 2021-09-03 NOTE — Telephone Encounter (Signed)
Patient is calling because [facility] said that hydrocodone is hard to get, can something else be substituted? Spoke with pharmacy, said that they can get the 10-325 mg , patient to take 1/2 tablet q 4 hours as needed for pain.Please advise.

## 2021-09-04 NOTE — Progress Notes (Signed)
Subjective: Armand Preast is a 41 y.o. is seen today in office s/p tarsal coalition excision right foot preformed on 08/27/2021.  States he is doing well and his pain is improving.  He has been in the cam boot, nonweightbearing.  Denies any systemic complaints such as fevers, chills, nausea, vomiting. No calf pain, chest pain, shortness of breath.   Objective: General: No acute distress, AAOx3  DP/PT pulses palpable 2/4, CRT < 3 sec to all digits.  Protective sensation intact. Motor function intact.  Right foot: Incision is well coapted without any evidence of dehiscence with sutures intact. There is no surrounding erythema, ascending cellulitis, fluctuance, crepitus, malodor, drainage/purulence. There is mild edema around the surgical site. There is mild pain along the surgical site.  No other areas of tenderness to bilateral lower extremities.  No other open lesions or pre-ulcerative lesions.  No pain with calf compression, swelling, warmth, erythema.   Assessment and Plan:  Status post partial coalition excision, doing well with no complications   -Treatment options discussed including all alternatives, risks, and complications -X-rays obtained reviewed.  No evidence of acute fracture.  Status post calcaneonavicular coalition resection. -Incisions healing well.  Small amount of antibiotic ointment and a bandage was applied.  Keep dressing clean, dry, intact. -Continue weight nonweightbearing for now. -CAM boot at all times -Ice/elevation -Pain medication as needed. -Monitor for any clinical signs or symptoms of infection and DVT/PE and directed to call the office immediately should any occur or go to the ER. -Follow-up as scheduled for suture removal or sooner if any problems arise. In the meantime, encouraged to call the office with any questions, concerns, change in symptoms.   Ovid Curd, DPM

## 2021-09-05 ENCOUNTER — Other Ambulatory Visit: Payer: Self-pay | Admitting: Family Medicine

## 2021-09-05 ENCOUNTER — Other Ambulatory Visit: Payer: Self-pay | Admitting: Podiatry

## 2021-09-05 ENCOUNTER — Encounter: Payer: Self-pay | Admitting: Podiatry

## 2021-09-05 DIAGNOSIS — F322 Major depressive disorder, single episode, severe without psychotic features: Secondary | ICD-10-CM

## 2021-09-05 DIAGNOSIS — F411 Generalized anxiety disorder: Secondary | ICD-10-CM

## 2021-09-05 MED ORDER — TRAMADOL HCL 50 MG PO TABS: 50.0000 mg | ORAL_TABLET | Freq: Three times a day (TID) | ORAL | 0 refills | Status: AC | PRN

## 2021-09-05 NOTE — Progress Notes (Signed)
Sent tramadol. Patient did not get the Vicodin but is having hives when taking the Vicodin.

## 2021-09-11 ENCOUNTER — Encounter: Payer: Self-pay | Admitting: Podiatry

## 2021-09-11 ENCOUNTER — Other Ambulatory Visit: Payer: Self-pay

## 2021-09-11 ENCOUNTER — Ambulatory Visit (INDEPENDENT_AMBULATORY_CARE_PROVIDER_SITE_OTHER): Payer: No Typology Code available for payment source | Admitting: Podiatry

## 2021-09-11 DIAGNOSIS — Z9889 Other specified postprocedural states: Secondary | ICD-10-CM

## 2021-09-11 DIAGNOSIS — Q6689 Other  specified congenital deformities of feet: Secondary | ICD-10-CM

## 2021-09-14 DIAGNOSIS — Q6689 Other  specified congenital deformities of feet: Secondary | ICD-10-CM | POA: Insufficient documentation

## 2021-09-14 NOTE — Progress Notes (Signed)
Subjective: Michael Ramirez is a 41 y.o. is seen today in office s/p tarsal coalition excision right foot preformed on 08/27/2021.  He presents today for suture removal.  States he still gets discomfort and describes more of a throbbing sensation.  He is continue the cam boot, nonweightbearing.  Denies any fevers or chills.  No chest pain, calf pain, shortness of breath.    Objective: General: No acute distress, AAOx3  DP/PT pulses palpable 2/4, CRT < 3 sec to all digits.  Protective sensation intact. Motor function intact.  Right foot: Incision is well coapted without any evidence of dehiscence with sutures intact.  There are some mild edema still noted along the surgical site.  There is no surrounding erythema, ascending cellulitis, fluctuance, crepitus, malodor, drainage/purulence. There is mild pain along the surgical site.  No other areas of tenderness to bilateral lower extremities.  No pain with calf compression, swelling, warmth, erythema.   Assessment and Plan:  Status post partial coalition excision  -Treatment options discussed including all alternatives, risks, and complications -Sutures removed today without complications incisions well coapted.  Small amount of Betadine ointment was applied followed by dressing.  He is sure to wash foot with soap and water and dry thoroughly apply a similar bandage. -Continue nonweightbearing, cam boot for now.  Discussed starting gentle gentle range of motion exercises. -Ice/elevation -Pain medication as needed. -Monitor for any clinical signs or symptoms of infection and DVT/PE and directed to call the office immediately should any occur or go to the ER. -Follow-up as scheduled or sooner if any problems arise. In the meantime, encouraged to call the office with any questions, concerns, change in symptoms.   Michael Ramirez, DPM

## 2021-09-17 ENCOUNTER — Encounter: Payer: Self-pay | Admitting: Family Medicine

## 2021-09-19 ENCOUNTER — Other Ambulatory Visit: Payer: Self-pay | Admitting: Podiatry

## 2021-09-19 ENCOUNTER — Telehealth: Payer: Self-pay | Admitting: *Deleted

## 2021-09-19 MED ORDER — TRAMADOL HCL 50 MG PO TABS: 50.0000 mg | ORAL_TABLET | Freq: Three times a day (TID) | ORAL | 0 refills | Status: AC | PRN

## 2021-09-19 MED ORDER — IBUPROFEN 800 MG PO TABS
800.0000 mg | ORAL_TABLET | Freq: Three times a day (TID) | ORAL | 0 refills | Status: DC | PRN
Start: 2021-09-19 — End: 2021-09-26

## 2021-09-19 NOTE — Telephone Encounter (Signed)
Patient is calling to request a medication refill on Tramadol-50mg ,ibuprofen-800mg  sent to pharmacy on file. Please advise.

## 2021-09-19 NOTE — Telephone Encounter (Signed)
Patient notified

## 2021-09-25 ENCOUNTER — Encounter: Payer: BC Managed Care – PPO | Admitting: Podiatry

## 2021-09-26 ENCOUNTER — Other Ambulatory Visit: Payer: Self-pay

## 2021-09-26 ENCOUNTER — Encounter: Payer: Self-pay | Admitting: Podiatry

## 2021-09-26 ENCOUNTER — Ambulatory Visit (INDEPENDENT_AMBULATORY_CARE_PROVIDER_SITE_OTHER): Payer: No Typology Code available for payment source

## 2021-09-26 ENCOUNTER — Ambulatory Visit (INDEPENDENT_AMBULATORY_CARE_PROVIDER_SITE_OTHER): Payer: No Typology Code available for payment source | Admitting: Podiatry

## 2021-09-26 DIAGNOSIS — Z9889 Other specified postprocedural states: Secondary | ICD-10-CM

## 2021-09-26 DIAGNOSIS — M778 Other enthesopathies, not elsewhere classified: Secondary | ICD-10-CM

## 2021-09-26 DIAGNOSIS — Q6689 Other  specified congenital deformities of feet: Secondary | ICD-10-CM

## 2021-09-26 IMAGING — MR MR ANKLE*R* W/O CM
5 series · 38 of 40 positions shown · non-contrast
Comparison: [DATE]

CLINICAL DATA: Right ankle pain since [REDACTED]. History of tendon
repair in 3338.

EXAM:
MRI OF THE RIGHT ANKLE WITHOUT CONTRAST
TECHNIQUE: Multiplanar, multisequence MR imaging of the ankle was performed. No
intravenous contrast was administered.

[Series 6: T2 fat-sat · axial · 3.0mm · 0.50mm/px · z∈[-101,+19]mm · 9 of 32 slices shown (1 of 2)]
[im 1/32]
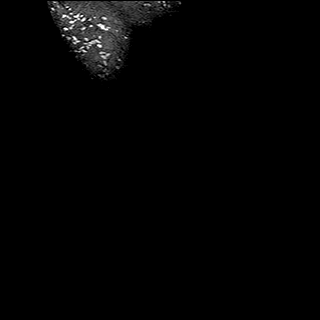
[im 4/32]
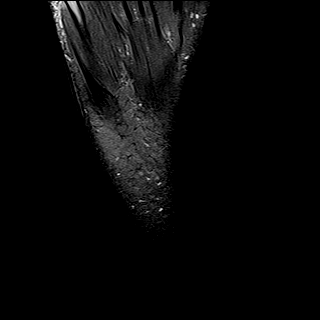
[im 8/32]
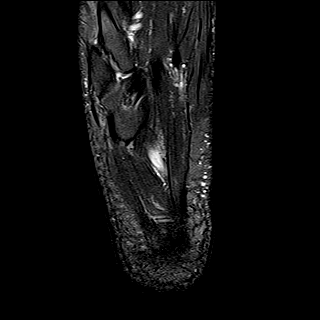
[im 12/32]
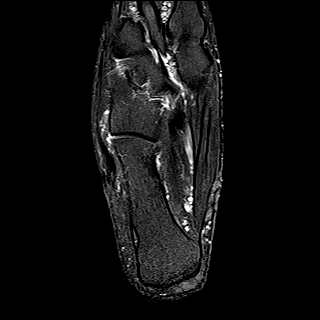
[im 16/32]
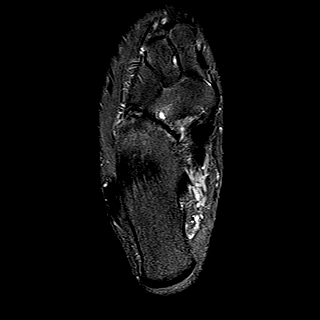
[im 20/32]
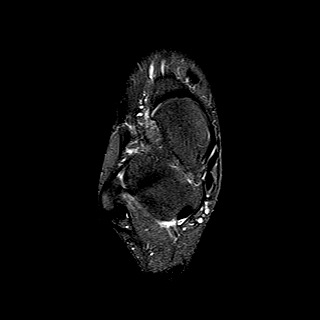
[im 24/32]
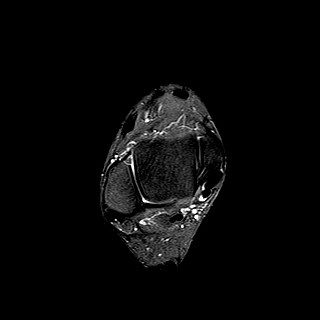
[im 28/32]
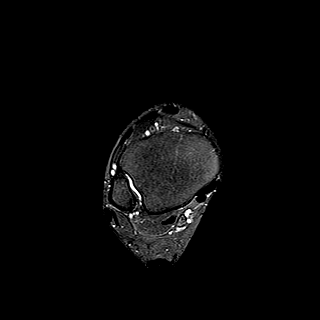
[im 32/32]
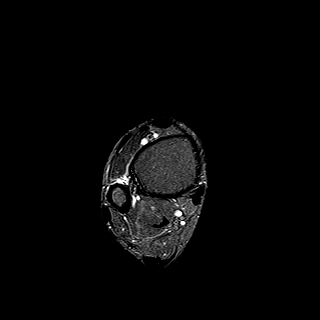

[Series 7: PD fat-sat · axial · 3.0mm · 0.50mm/px · z∈[-101,+19]mm · 9 of 32 slices shown]
[im 1/32]
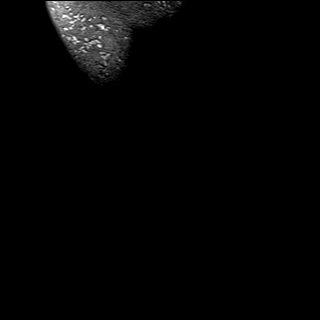
[im 4/32]
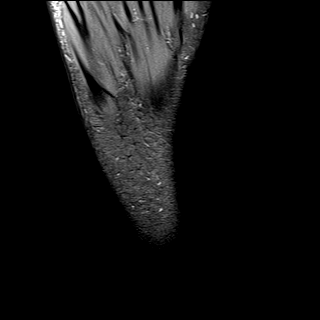
[im 8/32]
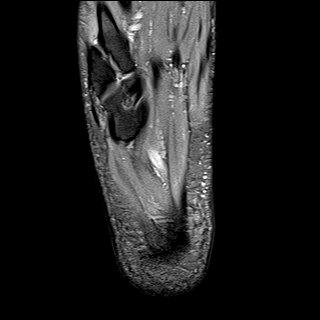
[im 12/32]
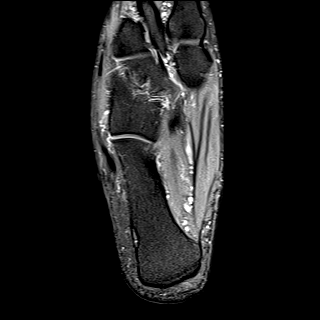
[im 16/32]
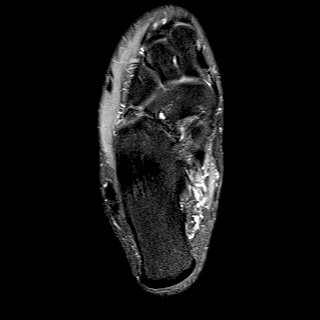
[im 20/32]
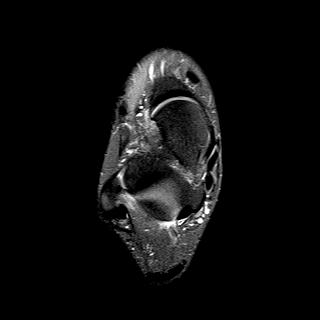
[im 24/32]
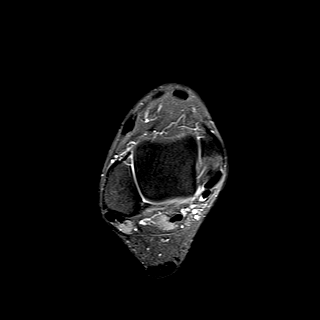
[im 28/32]
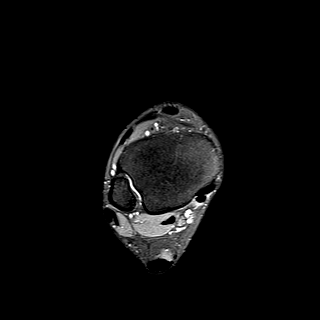
[im 32/32]
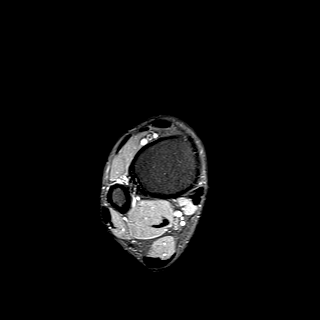

[Series 8: T1 · sagittal · 4.0mm · 0.56mm/px · 6 of 22 slices shown]
[im 1/22]
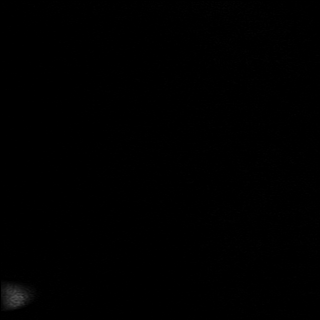
[im 5/22]
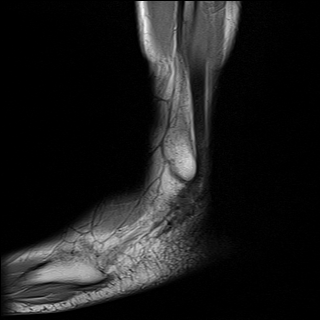
[im 9/22]
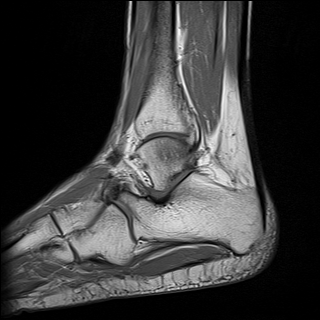
[im 13/22]
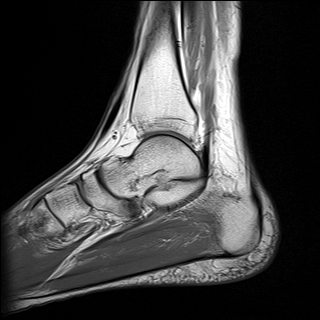
[im 17/22]
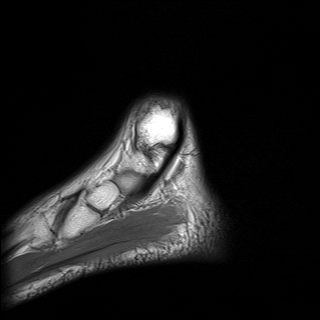
[im 22/22]
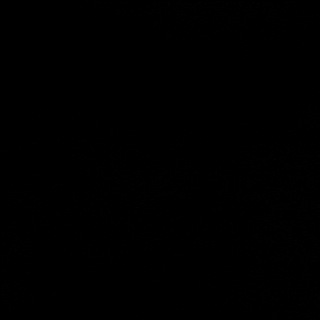

[Series 9: STIR · sagittal · 4.0mm · 0.35mm/px · 6 of 22 slices shown]
[im 1/22]
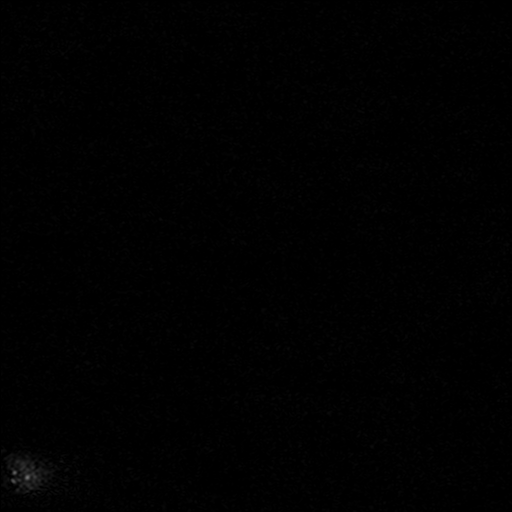
[im 5/22]
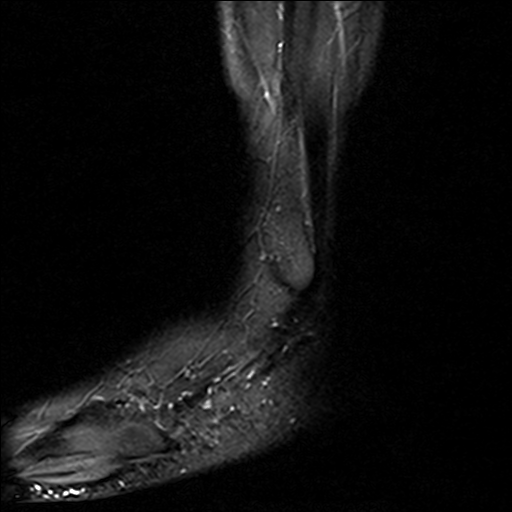
[im 9/22]
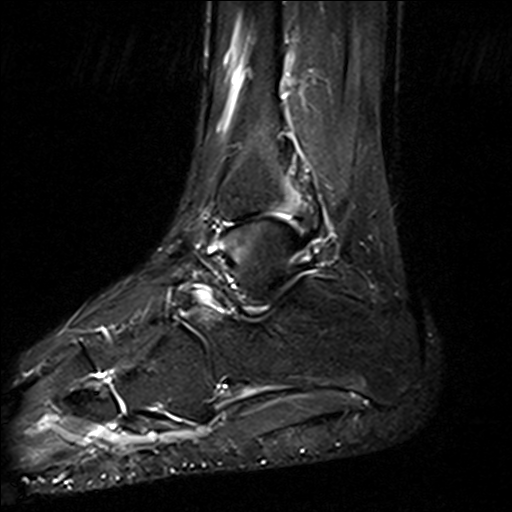
[im 13/22]
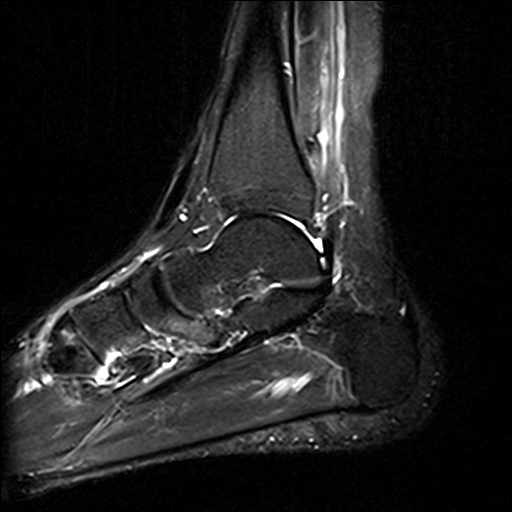
[im 17/22]
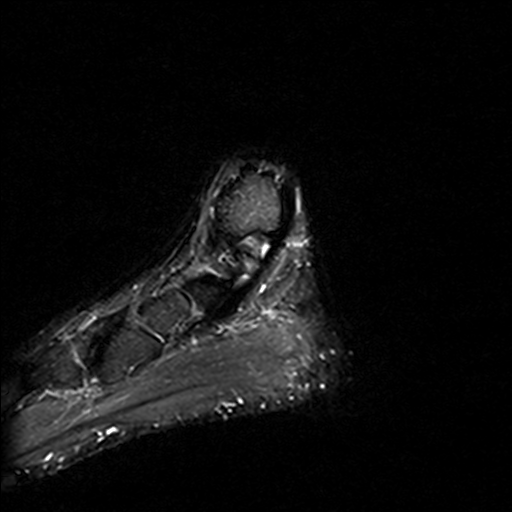
[im 22/22]
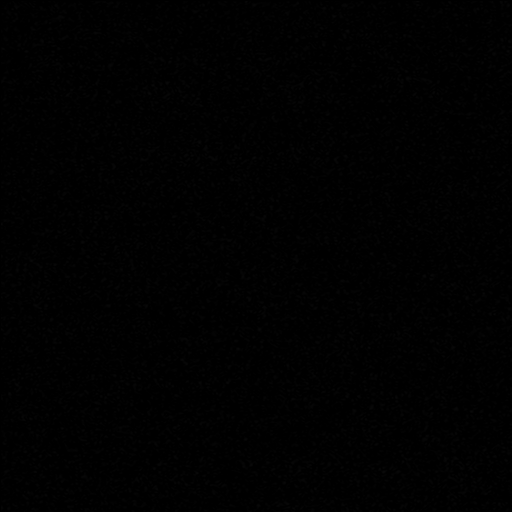

[Series 10: T2 fat-sat · coronal · 3.0mm · 0.50mm/px · 8 of 35 slices shown (2 of 2)]
[im 1/35]
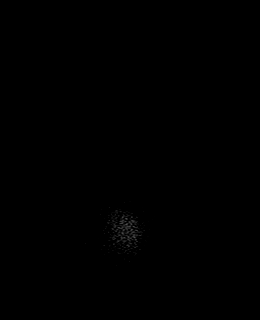
[im 4/35]
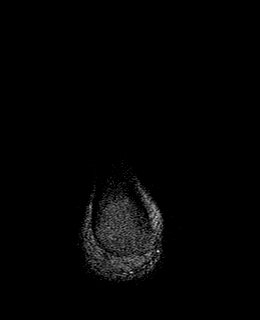
[im 12/35]
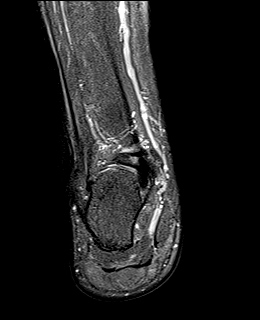
[im 16/35]
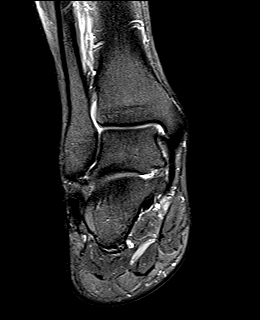
[im 19/35]
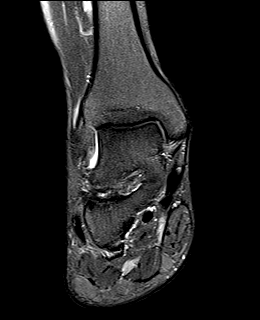
[im 23/35]
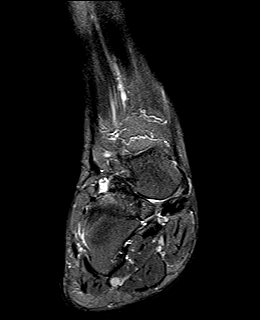
[im 31/35]
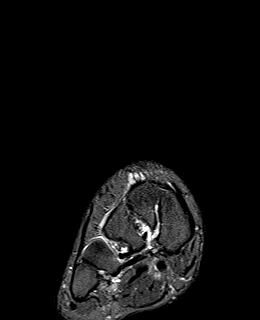
[im 35/35]
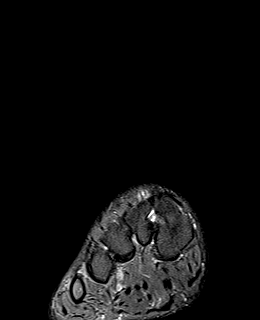

[38 of 40 positions shown; findings below may reference images not displayed]

FINDINGS: TENDONS

Peroneal: Peroneal longus tendon intact. Postsurgical changes of the
peroneus brevis without a discrete tear.

Posteromedial: Posterior tibial tendon intact. Flexor hallucis
longus tendon intact. Flexor digitorum longus tendon intact.

Anterior: Tibialis anterior tendon intact. Extensor hallucis longus
tendon intact Extensor digitorum longus tendon intact.

Achilles:  Intact.

Plantar Fascia: Intact.

LIGAMENTS

Lateral: Anterior talofibular ligament intact. Calcaneofibular
ligament intact. Posterior talofibular ligament intact. Anterior and
posterior tibiofibular ligaments intact.

Medial: Deltoid ligament intact. Spring ligament intact.

CARTILAGE

Ankle Joint: No joint effusion. Normal ankle mortise. No chondral
defect.

Subtalar Joints/Sinus Tarsi: Normal subtalar joints. No subtalar
joint effusion. Normal sinus tarsi.

Bones: No marrow signal abnormality. No fracture or dislocation.
Broad pseudoarticulation of the anterior process of the calcaneus
and lateral aspect of the navicular with marrow edema on either side
of the pseudoarticulation consistent with fibrocartilaginous
coalition.

Soft Tissue: No fluid collection or hematoma. Muscles are normal
without edema or atrophy. Tarsal tunnel is normal.
IMPRESSION: 1. Calcaneonavicular fibrocartilaginous coalition with associated
marrow edema on either side of the pseudoarticulation.
2.  No acute osseous injury of the right ankle.

## 2021-09-26 MED ORDER — TRAMADOL HCL 50 MG PO TABS: 50.0000 mg | ORAL_TABLET | Freq: Three times a day (TID) | ORAL | 0 refills | Status: AC | PRN

## 2021-09-26 MED ORDER — IBUPROFEN 800 MG PO TABS
800.0000 mg | ORAL_TABLET | Freq: Three times a day (TID) | ORAL | 0 refills | Status: DC | PRN
Start: 2021-09-26 — End: 2021-10-22

## 2021-09-26 MED ORDER — GABAPENTIN 100 MG PO CAPS: 100.0000 mg | ORAL_CAPSULE | Freq: Every day | ORAL | 0 refills | Status: DC

## 2021-10-05 NOTE — Progress Notes (Signed)
Subjective: ?Michael Ramirez is a 41 y.o. is seen today in office s/p tarsal coalition excision right foot preformed on 08/27/2021.  He still does report throbbing, stabbing pain localized to the area.  He still taking tramadol at nighttime second ibuprofen 2-3 times a day.  He still been nonweightbearing in the cam boot.  No recent injury or trauma.  No new concerns.  No fever or chills.   ? ?Objective: ?General: No acute distress, AAOx3  ?DP/PT pulses palpable 2/4, CRT < 3 sec to all digits.  ?Protective sensation intact. Motor function intact.  ?Right foot: Incision is well coapted without any evidence of dehiscence and scar is formed.  There is mild edema present dressing on the surgical site but no other areas of swelling noted.  Tenderness along the surgical site but again no other areas of pinpoint tenderness.  No pain with subtalar joint range of motion.  No pain with ankle joint range of motion.  MMT 5/5.   ? ?Assessment and Plan:  ?Status post partial coalition excision ? ?-Treatment options discussed including all alternatives, risks, and complications ?-At this time we will restart physical therapy and her prescription for physical therapy was written.  Continue with pain medication nighttime as well as ibuprofen on the day.  Were also can add gabapentin given the nerve symptoms.  Continue range of motion exercises as well.  He has been doing some passive range of motion at home.  As he starts physical therapy can transition to partial weight-bear as tolerated before full weightbearing but still in the cam boot.  Continue ice and elevate as well as compression to help with any postoperative edema. ? ?Vivi Barrack DPM ?

## 2021-10-10 ENCOUNTER — Other Ambulatory Visit: Payer: Self-pay | Admitting: Podiatry

## 2021-10-10 ENCOUNTER — Telehealth: Payer: Self-pay | Admitting: *Deleted

## 2021-10-10 MED ORDER — TRAMADOL HCL 50 MG PO TABS: 50.0000 mg | ORAL_TABLET | Freq: Three times a day (TID) | ORAL | 0 refills | Status: AC | PRN

## 2021-10-10 NOTE — Telephone Encounter (Signed)
Patient is calling for his pain medicine refill(tramadol), please advise. ?

## 2021-10-14 ENCOUNTER — Other Ambulatory Visit: Payer: Self-pay

## 2021-10-14 ENCOUNTER — Encounter: Payer: Self-pay | Admitting: Podiatry

## 2021-10-14 ENCOUNTER — Ambulatory Visit (INDEPENDENT_AMBULATORY_CARE_PROVIDER_SITE_OTHER): Payer: BC Managed Care – PPO | Admitting: Podiatry

## 2021-10-14 DIAGNOSIS — Q6689 Other  specified congenital deformities of feet: Secondary | ICD-10-CM

## 2021-10-14 DIAGNOSIS — Z9889 Other specified postprocedural states: Secondary | ICD-10-CM

## 2021-10-20 DIAGNOSIS — J3089 Other allergic rhinitis: Secondary | ICD-10-CM | POA: Diagnosis not present

## 2021-10-20 DIAGNOSIS — J301 Allergic rhinitis due to pollen: Secondary | ICD-10-CM | POA: Diagnosis not present

## 2021-10-20 DIAGNOSIS — R21 Rash and other nonspecific skin eruption: Secondary | ICD-10-CM | POA: Diagnosis not present

## 2021-10-21 ENCOUNTER — Telehealth: Payer: Self-pay | Admitting: Podiatry

## 2021-10-21 NOTE — Telephone Encounter (Signed)
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 Please include in the note Mr. Hyson work status. Thanks ?

## 2021-10-21 NOTE — Progress Notes (Signed)
Subjective: ?Michael Ramirez is a 41 y.o. is seen today in office s/p tarsal coalition excision right foot preformed on 08/27/2021.  This is something soreness and stabbing at times.  No other pain distinguishing 1 in the evening or night.  Currently no significant pain.  Still in the cam boot using crutches.  No recent injury or falls.  [facility].  No fevers or chills.  No other concerns.   ? ?Objective: ?General: No acute distress, AAOx3  ?DP/PT pulses palpable 2/4, CRT < 3 sec to all digits.  ?Protective sensation intact. Motor function intact.  ?Right foot: Incision is well coapted without any evidence of dehiscence and scar is formed.  Mild there is palpation to the long incision.  No pain with ankle or subtalar joint range of motion.  There is minimal edema at the surgical site.  There is no erythema or warmth or any signs of infection. ? ?Assessment and Plan:  ?Status post partial coalition excision ? ?-Treatment options discussed including all alternatives, risks, and complications ?-Continue physical therapy.  I have sent orders that he can start to transition to weightbearing as tolerated in the cam boot and then transitioning to a regular shoe as tolerated.  Continue ice elevate. ?-Tramadol for pain as well as ibuprofen. ?-Range of motion exercises ?-Unfortunately the point is not able to return back to work. I will keep him out of work for now.  We will see him back in 3 weeks or sooner if needed. ? ?Vivi Barrack DPM ? ?

## 2021-10-22 ENCOUNTER — Other Ambulatory Visit: Payer: Self-pay | Admitting: Podiatry

## 2021-10-22 ENCOUNTER — Telehealth: Payer: Self-pay | Admitting: *Deleted

## 2021-10-22 MED ORDER — IBUPROFEN 800 MG PO TABS
800.0000 mg | ORAL_TABLET | Freq: Three times a day (TID) | ORAL | 0 refills | Status: DC | PRN
Start: 2021-10-22 — End: 2021-11-14

## 2021-10-22 MED ORDER — TRAMADOL HCL 50 MG PO TABS
50.0000 mg | ORAL_TABLET | Freq: Three times a day (TID) | ORAL | 0 refills | Status: AC | PRN
Start: 2021-10-22 — End: 2021-10-27

## 2021-10-22 NOTE — Telephone Encounter (Signed)
Patient is calling to request a refill of tramadol, and ibuprofen. Please advise. ?

## 2021-10-22 NOTE — Telephone Encounter (Signed)
Patient has been notified

## 2021-10-28 ENCOUNTER — Other Ambulatory Visit: Payer: Self-pay | Admitting: Podiatry

## 2021-10-28 NOTE — Telephone Encounter (Signed)
Please advise 

## 2021-10-31 ENCOUNTER — Encounter: Payer: Self-pay | Admitting: Podiatry

## 2021-11-01 NOTE — Telephone Encounter (Signed)
Marylene Land- can you put one in the mail (I didn't know who else to send this message to as Tacey Ruiz is out and I don't want it to get lost!) Thanks! ?

## 2021-11-03 ENCOUNTER — Other Ambulatory Visit: Payer: Self-pay | Admitting: Podiatry

## 2021-11-03 ENCOUNTER — Telehealth: Payer: Self-pay

## 2021-11-03 MED ORDER — TRAMADOL HCL 50 MG PO TABS: 50.0000 mg | ORAL_TABLET | Freq: Three times a day (TID) | ORAL | 0 refills | Status: AC | PRN

## 2021-11-03 NOTE — Telephone Encounter (Signed)
Patient called the office requesting a refill of tramadol ?

## 2021-11-06 ENCOUNTER — Encounter: Payer: Self-pay | Admitting: Podiatry

## 2021-11-06 ENCOUNTER — Ambulatory Visit (INDEPENDENT_AMBULATORY_CARE_PROVIDER_SITE_OTHER): Payer: BC Managed Care – PPO | Admitting: Podiatry

## 2021-11-06 DIAGNOSIS — Q6689 Other  specified congenital deformities of feet: Secondary | ICD-10-CM

## 2021-11-06 DIAGNOSIS — Z9889 Other specified postprocedural states: Secondary | ICD-10-CM

## 2021-11-09 NOTE — Progress Notes (Signed)
Subjective: ?Jake Fuhrmann is a 41 y.o. is seen today in office s/p tarsal coalition excision right foot preformed on 08/27/2021.  Overall feels that he is getting better but still having discomfort.  Has been doing physical therapy twice a week.  He presents today wearing a cam boot still using crutches.  No recent injuries or changes otherwise since I last saw him.  He has no new concerns.  ? ?Objective: ?General: No acute distress, AAOx3  ?DP/PT pulses palpable 2/4, CRT < 3 sec to all digits.  ?Protective sensation intact. Motor function intact.  ?Right foot: Incision is well coapted without any evidence of dehiscence and scar is formed.  There is mild tenderness palpation along the surgical site.  Subtalar range of motion intact but does appear to be somewhat restricted today compared to prior appointments.  There is no other areas of discomfort noted today. ?There is no erythema or warmth or any signs of infection. ? ?Assessment and Plan:  ?Status post partial coalition excision ? ?-Treatment options discussed including all alternatives, risks, and complications ?-I want him to continue with physical therapy and increase this to 3 times a week and a new order was written.  Continue range of motion I discussed with him also we need to start trying to use his foot more and start to ambulate.  Continue with cam boot for now but also discussed that he can start to wear regular shoe as tolerated.  He can start with a regular shoe at physical therapy and gradually increase the time wearing a regular shoe. ?-Unfortunately still not able to return to work at this time. ?-Tramadol for pain as well as ibuprofen. ?-Range of motion exercises ? ?Return in about 4 weeks (around 12/04/2021). ? ?Vivi Barrack DPM ? ?

## 2021-11-14 ENCOUNTER — Telehealth: Payer: Self-pay | Admitting: Podiatry

## 2021-11-14 ENCOUNTER — Other Ambulatory Visit: Payer: Self-pay | Admitting: Podiatry

## 2021-11-14 MED ORDER — IBUPROFEN 800 MG PO TABS: 800.0000 mg | ORAL_TABLET | Freq: Three times a day (TID) | ORAL | 0 refills | Status: DC | PRN

## 2021-11-14 MED ORDER — TRAMADOL HCL 50 MG PO TABS: 50.0000 mg | ORAL_TABLET | Freq: Three times a day (TID) | ORAL | 0 refills | Status: AC | PRN

## 2021-11-14 NOTE — Telephone Encounter (Signed)
I'm calling to get a refill for the Ibuprofen and Tramadol.  ?

## 2021-11-17 NOTE — Telephone Encounter (Signed)
Left v/m letting pt know that the Ibuprofen and Tramadol have been sent in. Told pt to either call or send a message via MyChart with any questions and/or concerns. ?

## 2021-11-25 ENCOUNTER — Other Ambulatory Visit: Payer: Self-pay | Admitting: Podiatry

## 2021-11-25 ENCOUNTER — Telehealth: Payer: Self-pay | Admitting: *Deleted

## 2021-11-25 MED ORDER — TRAMADOL HCL 50 MG PO TABS: 50.0000 mg | ORAL_TABLET | Freq: Three times a day (TID) | ORAL | 0 refills | Status: AC | PRN

## 2021-11-25 NOTE — Telephone Encounter (Signed)
Patient is requesting a refill of the Tramadol to be sent to pharmacy on file. Please advise. ?

## 2021-11-25 NOTE — Telephone Encounter (Signed)
Patient has been notified

## 2021-12-05 ENCOUNTER — Ambulatory Visit (INDEPENDENT_AMBULATORY_CARE_PROVIDER_SITE_OTHER): Payer: No Typology Code available for payment source | Admitting: Podiatry

## 2021-12-05 ENCOUNTER — Ambulatory Visit (INDEPENDENT_AMBULATORY_CARE_PROVIDER_SITE_OTHER): Payer: BC Managed Care – PPO

## 2021-12-05 ENCOUNTER — Encounter: Payer: Self-pay | Admitting: Podiatry

## 2021-12-05 DIAGNOSIS — Q6689 Other  specified congenital deformities of feet: Secondary | ICD-10-CM

## 2021-12-05 DIAGNOSIS — Z9889 Other specified postprocedural states: Secondary | ICD-10-CM

## 2021-12-08 ENCOUNTER — Other Ambulatory Visit: Payer: Self-pay | Admitting: Podiatry

## 2021-12-08 MED ORDER — TRAMADOL HCL 50 MG PO TABS: 50.0000 mg | ORAL_TABLET | Freq: Three times a day (TID) | ORAL | 0 refills | Status: AC | PRN

## 2021-12-08 NOTE — Progress Notes (Signed)
Subjective: ?Michael Ramirez is a 41 y.o. is seen today in office s/p tarsal coalition excision right foot preformed on 08/27/2021.  He is continue with physical therapy and seems to be getting better with this.  He states that the gabapentin has been helping and asking for refill.  Things tramadol as needed for pain.  He recently just left physical therapy right before the appointment today and he thinks it may be more sore because of that.  He is wearing a regular shoe.  ? ?Objective: ?General: No acute distress, AAOx3  ?DP/PT pulses palpable 2/4, CRT < 3 sec to all digits.  ?Protective sensation intact. Motor function intact.  ?Right foot: Incision is well coapted without any evidence of dehiscence and scar is formed.  There is mild tenderness palpation along the surgical site.  Subtalar range of motion intact but does appear to be somewhat restricted today but somewhat improved compared to last appointment.  There is no other areas of discomfort noted today. ?There is no erythema or warmth or any signs of infection. ? ?Assessment and Plan:  ?Status post partial coalition excision ? ?-Treatment options discussed including all alternatives, risks, and complications ?-X-rays obtained reviewed of the right foot.  3 views were obtained.  Notes of acute fracture.  Status post calcaneonavicular coalition resection ?-Physical therapy.  Continue work range of motion as well as wearing shoes and good arch support.  I refilled gabapentin.  Tramadol as needed for pain.  At this point I do not think he is ready to return to work.  I will see him back next appointment for further evaluation.  We discussed at home using a foam roller as he does roll his leg on a ball which helps some. ? ?Return in about 4 weeks (around 01/02/2022). ? ?Vivi Barrack DPM ?

## 2021-12-08 NOTE — Telephone Encounter (Signed)
Patient requesting a refill of tramadol,please advise. 

## 2021-12-08 NOTE — Telephone Encounter (Signed)
Patient notified

## 2021-12-18 ENCOUNTER — Telehealth: Payer: Self-pay | Admitting: *Deleted

## 2021-12-18 ENCOUNTER — Other Ambulatory Visit: Payer: Self-pay | Admitting: Podiatry

## 2021-12-18 ENCOUNTER — Encounter: Payer: Self-pay | Admitting: Podiatry

## 2021-12-18 MED ORDER — IBUPROFEN 800 MG PO TABS: 800.0000 mg | ORAL_TABLET | Freq: Three times a day (TID) | ORAL | 0 refills | Status: DC | PRN

## 2021-12-18 MED ORDER — GABAPENTIN 100 MG PO CAPS: 100.0000 mg | ORAL_CAPSULE | Freq: Every day | ORAL | 0 refills | Status: DC

## 2021-12-18 MED ORDER — TRAMADOL HCL 50 MG PO TABS: 50.0000 mg | ORAL_TABLET | Freq: Three times a day (TID) | ORAL | 0 refills | Status: AC | PRN

## 2021-12-18 NOTE — Telephone Encounter (Signed)
Patient is calling for a refill of Gabapentin,100  mg,ibuprofen,800 mg, Tramadol,50 mg. Please advise.

## 2021-12-19 NOTE — Telephone Encounter (Signed)
Patient has been notified

## 2021-12-31 ENCOUNTER — Telehealth: Payer: Self-pay | Admitting: *Deleted

## 2021-12-31 ENCOUNTER — Other Ambulatory Visit: Payer: Self-pay | Admitting: Podiatry

## 2021-12-31 MED ORDER — TRAMADOL HCL 50 MG PO TABS
50.0000 mg | ORAL_TABLET | Freq: Three times a day (TID) | ORAL | 0 refills | Status: AC | PRN
Start: 2021-12-31 — End: 2022-01-05

## 2021-12-31 NOTE — Telephone Encounter (Signed)
Patient  requesting a refill of tramadol-50 mg. Please advise.

## 2021-12-31 NOTE — Telephone Encounter (Signed)
Patient notified

## 2022-01-05 ENCOUNTER — Encounter: Payer: Self-pay | Admitting: Podiatry

## 2022-01-05 ENCOUNTER — Ambulatory Visit (INDEPENDENT_AMBULATORY_CARE_PROVIDER_SITE_OTHER): Payer: BC Managed Care – PPO | Admitting: Podiatry

## 2022-01-05 DIAGNOSIS — M7751 Other enthesopathy of right foot: Secondary | ICD-10-CM | POA: Diagnosis not present

## 2022-01-05 DIAGNOSIS — Q6689 Other  specified congenital deformities of feet: Secondary | ICD-10-CM

## 2022-01-06 MED ORDER — METHYLPREDNISOLONE 4 MG PO TBPK: ORAL_TABLET | ORAL | 0 refills | Status: DC

## 2022-01-06 NOTE — Progress Notes (Signed)
Subjective: 41 year old male presents the office today for Evaluation undergoing tarsal coalition excision on 08/27/2021.  He states that he can use the foot more currently more active with daily activities however he states that doing this he is having some discomfort still.  He has been doing physical therapy.  He feels that the pain in the morning is not as bad range of motion to be getting better.  Objective: AAO x3, NAD DP/PT pulses palpable bilaterally, CRT less than 3 seconds There is discomfort mostly on the course of the peroneal tendon.  Mild discomfort in the surgical site as well.  There is trace edema there is no erythema or warmth.  Flexor, extensor tendons appear to be intact.  MMT 5/5.  No pain with calf compression, swelling, warmth, erythema  Assessment: Peroneal tendinitis, tarsal coalition excision  Plan: -All treatment options discussed with the patient including all alternatives, risks, complications.  -At this point I did prescribe a Medrol Dosepak.  Encouraged physical therapy, range of motion exercises as well as wearing shoes and good arch support.  Keeping out of work for 4 more weeks and at that time return to work will likely restrictions. -Will continue physical therapy.  Add iontophoresis. -Patient encouraged to call the office with any questions, concerns, change in symptoms.   Vivi Barrack DPM

## 2022-01-12 ENCOUNTER — Telehealth: Payer: Self-pay | Admitting: *Deleted

## 2022-01-12 ENCOUNTER — Other Ambulatory Visit: Payer: Self-pay | Admitting: Podiatry

## 2022-01-12 MED ORDER — TRAMADOL HCL 50 MG PO TABS: 50.0000 mg | ORAL_TABLET | Freq: Three times a day (TID) | ORAL | 0 refills | Status: AC | PRN

## 2022-01-12 NOTE — Telephone Encounter (Signed)
Patient is calling for a medication refill of Tramadol,please advise.

## 2022-01-21 ENCOUNTER — Telehealth: Payer: Self-pay | Admitting: *Deleted

## 2022-01-21 ENCOUNTER — Other Ambulatory Visit: Payer: Self-pay | Admitting: Podiatry

## 2022-01-21 MED ORDER — TRAMADOL HCL 50 MG PO TABS
50.0000 mg | ORAL_TABLET | Freq: Three times a day (TID) | ORAL | 0 refills | Status: AC | PRN
Start: 2022-01-21 — End: 2022-01-26

## 2022-01-21 MED ORDER — GABAPENTIN 100 MG PO CAPS: 100.0000 mg | ORAL_CAPSULE | Freq: Every day | ORAL | 0 refills | Status: DC

## 2022-01-21 NOTE — Telephone Encounter (Signed)
Patient requesting medication (Tramadol,Gabapentin)refill. Please advise.

## 2022-01-22 NOTE — Telephone Encounter (Signed)
Patient notified thru voice message

## 2022-02-09 ENCOUNTER — Ambulatory Visit (INDEPENDENT_AMBULATORY_CARE_PROVIDER_SITE_OTHER): Payer: BC Managed Care – PPO | Admitting: Podiatry

## 2022-02-09 ENCOUNTER — Encounter: Payer: Self-pay | Admitting: Podiatry

## 2022-02-09 DIAGNOSIS — Q6689 Other  specified congenital deformities of feet: Secondary | ICD-10-CM | POA: Diagnosis not present

## 2022-02-09 DIAGNOSIS — M7751 Other enthesopathy of right foot: Secondary | ICD-10-CM

## 2022-02-09 MED ORDER — GABAPENTIN 100 MG PO CAPS: 100.0000 mg | ORAL_CAPSULE | Freq: Every day | ORAL | 0 refills | Status: DC

## 2022-02-09 MED ORDER — TRAMADOL HCL 50 MG PO TABS: 50.0000 mg | ORAL_TABLET | Freq: Three times a day (TID) | ORAL | 0 refills | Status: AC | PRN

## 2022-02-09 NOTE — Progress Notes (Signed)
Subjective: 41 year old male presents the office today for Evaluation undergoing tarsal coalition excision on 08/27/2021. States he has moved and has steps now, going down is sore. Otherwise he is doing about the same. States when he tries to roll off of his toes he has a Cabin crew" and makes him want to walk more flat to try to prevent it from bending. He has been doing PT and the dry needling helped for about a day, short term. The next day it will hurt more for a few days before back to the normal ache".   Objective: AAO x3, NAD DP/PT pulses palpable bilaterally, CRT less than 3 seconds There is discomfort mostly on the course of the peroneal tendon, and today along the Achilles tendon distally.  Clinically the tendons appear to be intact.  Today there is no significant discomfort in the surgical site as well.  There is no significant edema there is no erythema or warmth.  Flexor, extensor tendons appear to be intact.  MMT 5/5.  No pain with calf compression, swelling, warmth, erythema  Assessment: Peroneal/Achilles tendinitis, tarsal coalition excision  Plan: -All treatment options discussed with the patient including all alternatives, risks, complications.  -At this point he is in continue gabapentin as needed but pain medication when he is not working.  Try to return to work on a part-time basis with the following restrictions below.  Continue with supportive shoe gear.  I added a lateral post to his insert to see if this will help take pressure off the lateral aspect of the foot as well. -8 hours shift, Monday through Friday. Sit 15 minutes every 2 hours and allow to sit and ice, ankle brace as needed. Will do this for 6 weeks and will then further evaluate.  -Continue PT for now. -Patient encouraged to call the office with any questions, concerns, change in symptoms.   Vivi Barrack DPM

## 2022-02-19 ENCOUNTER — Other Ambulatory Visit: Payer: Self-pay | Admitting: Podiatry

## 2022-02-19 ENCOUNTER — Telehealth: Payer: Self-pay | Admitting: *Deleted

## 2022-02-19 MED ORDER — TRAMADOL HCL 50 MG PO TABS: 50.0000 mg | ORAL_TABLET | Freq: Three times a day (TID) | ORAL | 0 refills | Status: AC | PRN

## 2022-02-19 NOTE — Telephone Encounter (Signed)
Patient is calling for a medication refill(Tramadol-50 mg), please advise.

## 2022-02-20 NOTE — Telephone Encounter (Signed)
Patient notified

## 2022-03-04 ENCOUNTER — Other Ambulatory Visit: Payer: Self-pay | Admitting: Family Medicine

## 2022-03-04 DIAGNOSIS — F411 Generalized anxiety disorder: Secondary | ICD-10-CM

## 2022-03-04 DIAGNOSIS — F322 Major depressive disorder, single episode, severe without psychotic features: Secondary | ICD-10-CM

## 2022-03-05 ENCOUNTER — Telehealth: Payer: Self-pay

## 2022-03-05 ENCOUNTER — Other Ambulatory Visit: Payer: Self-pay | Admitting: Podiatry

## 2022-03-05 MED ORDER — TRAMADOL HCL 50 MG PO TABS: 50.0000 mg | ORAL_TABLET | Freq: Three times a day (TID) | ORAL | 0 refills | Status: AC | PRN

## 2022-03-09 NOTE — Telephone Encounter (Signed)
No further notes are needed  

## 2022-03-12 ENCOUNTER — Telehealth: Payer: Self-pay

## 2022-03-12 NOTE — Telephone Encounter (Signed)
traMADol (ULTRAM) 50 MG tablet  Prior authorization has been started today on 03/12/22.

## 2022-03-12 NOTE — Telephone Encounter (Signed)
PA was approved on 03/12/22

## 2022-03-18 ENCOUNTER — Other Ambulatory Visit: Payer: Self-pay | Admitting: Podiatry

## 2022-03-18 ENCOUNTER — Telehealth: Payer: Self-pay | Admitting: *Deleted

## 2022-03-18 MED ORDER — TRAMADOL HCL 50 MG PO TABS: 50.0000 mg | ORAL_TABLET | Freq: Three times a day (TID) | ORAL | 0 refills | Status: AC | PRN

## 2022-03-18 NOTE — Telephone Encounter (Signed)
Patient requesting a refill of tramadol,please advise.

## 2022-03-19 NOTE — Telephone Encounter (Signed)
Patient has been notified thru voice message

## 2022-03-23 ENCOUNTER — Encounter: Payer: BC Managed Care – PPO | Admitting: Podiatry

## 2022-04-16 IMAGING — DX DG CHEST 2V
2 series · 2 of 2 positions shown · non-contrast
Comparison: January 04, 2005.

CLINICAL DATA: Shortness of breath.

EXAM:
CHEST - 2 VIEW

[chest pa]
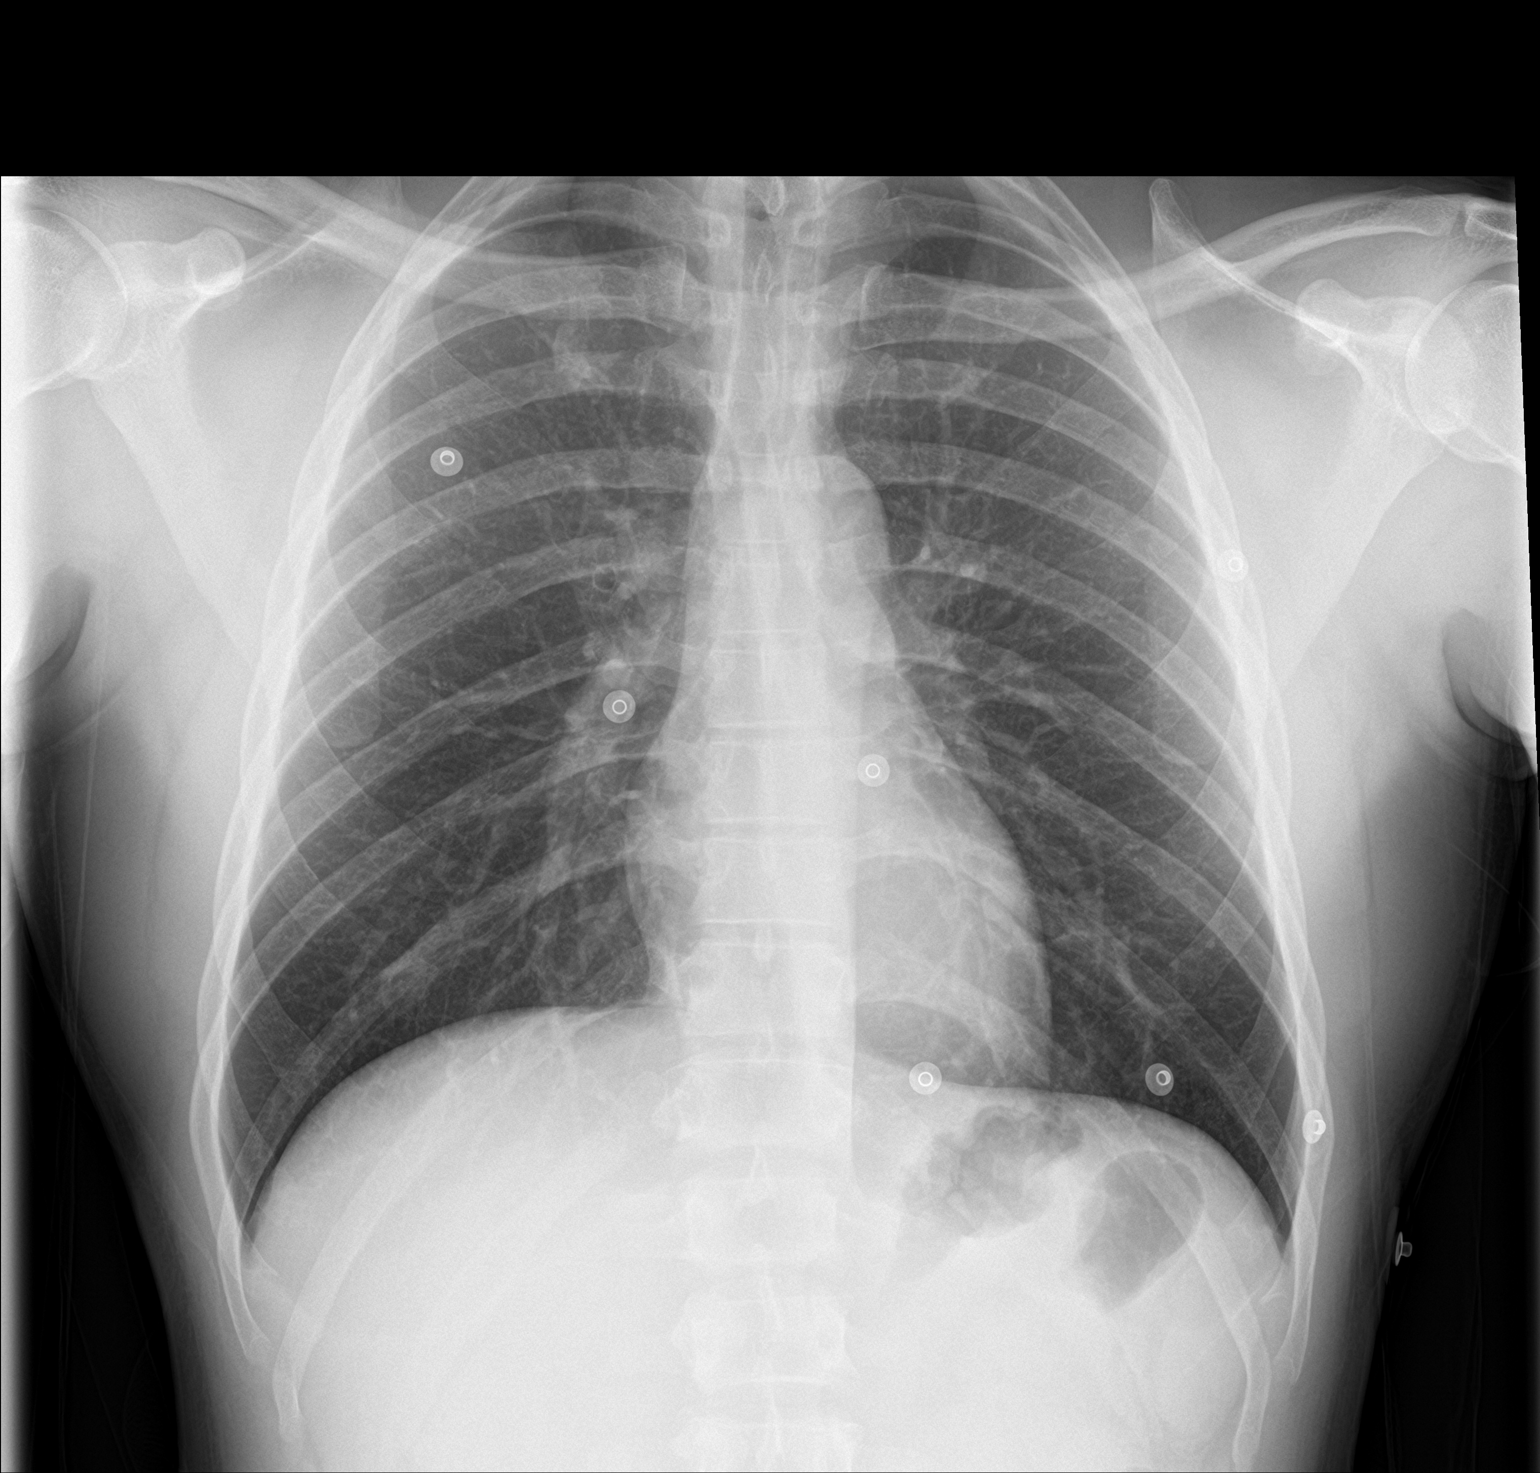

[chest lat]
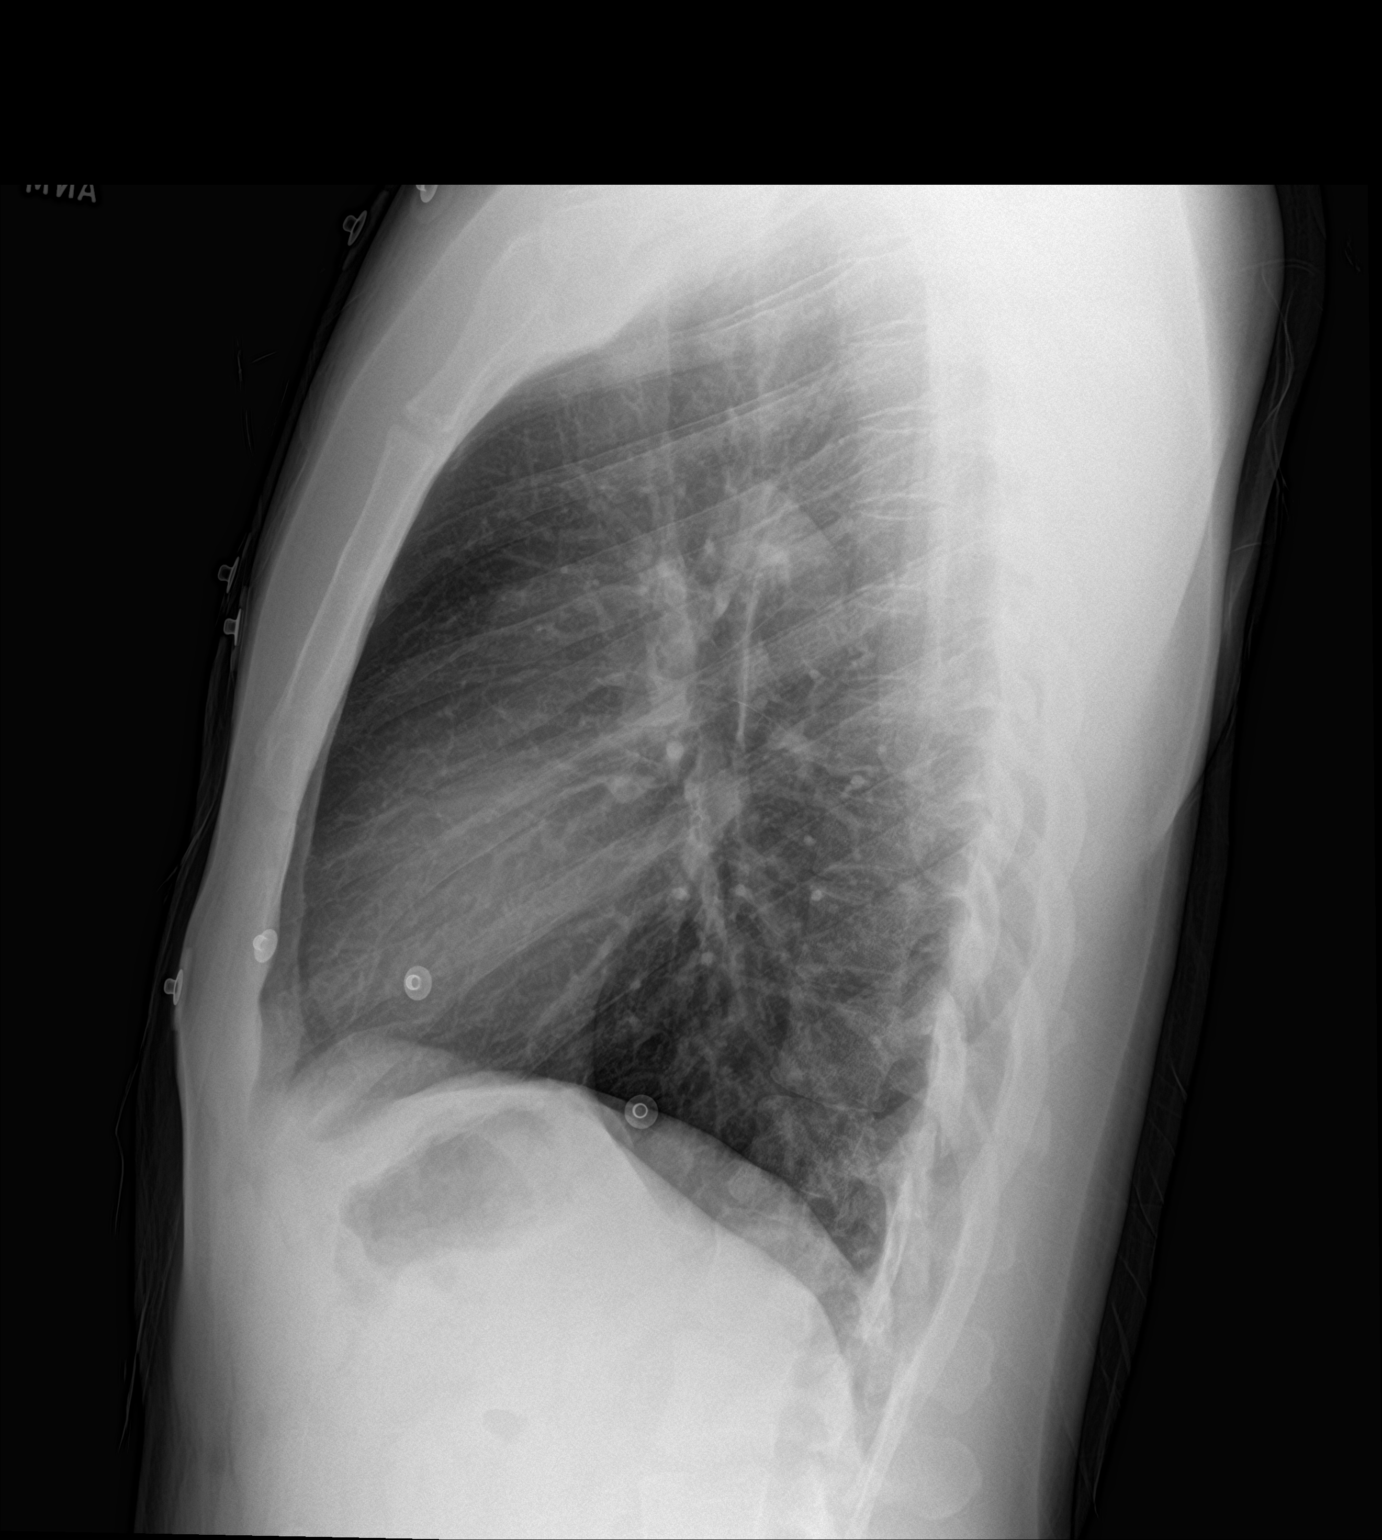

[2 of 2 positions shown; findings below may reference images not displayed]

FINDINGS: The heart size and mediastinal contours are within normal limits.
Both lungs are clear. The visualized skeletal structures are
unremarkable.
IMPRESSION: No active cardiopulmonary disease.

## 2022-08-25 ENCOUNTER — Ambulatory Visit: Payer: Self-pay

## 2022-08-25 ENCOUNTER — Other Ambulatory Visit: Payer: Self-pay | Admitting: Nurse Practitioner

## 2022-08-25 DIAGNOSIS — Z0000 Encounter for general adult medical examination without abnormal findings: Secondary | ICD-10-CM

## 2022-10-02 ENCOUNTER — Ambulatory Visit: Payer: PRIVATE HEALTH INSURANCE | Admitting: Family Medicine

## 2022-10-02 VITALS — BP 100/70 | HR 65 | Temp 98.1°F | Wt 182.4 lb

## 2022-10-02 DIAGNOSIS — F411 Generalized anxiety disorder: Secondary | ICD-10-CM | POA: Diagnosis not present

## 2022-10-02 DIAGNOSIS — R319 Hematuria, unspecified: Secondary | ICD-10-CM | POA: Diagnosis not present

## 2022-10-02 DIAGNOSIS — R55 Syncope and collapse: Secondary | ICD-10-CM | POA: Diagnosis not present

## 2022-10-02 LAB — CBC WITH DIFFERENTIAL/PLATELET
Basophils Absolute: 0 10*3/uL (ref 0.0–0.1)
Basophils Relative: 0.6 % (ref 0.0–3.0)
Eosinophils Absolute: 0.1 10*3/uL (ref 0.0–0.7)
Eosinophils Relative: 2.1 % (ref 0.0–5.0)
HCT: 49.5 % (ref 39.0–52.0)
Hemoglobin: 16.2 g/dL (ref 13.0–17.0)
Lymphocytes Relative: 42.5 % (ref 12.0–46.0)
Lymphs Abs: 1.6 10*3/uL (ref 0.7–4.0)
MCHC: 32.7 g/dL (ref 30.0–36.0)
MCV: 81.5 fl (ref 78.0–100.0)
Monocytes Absolute: 0.5 10*3/uL (ref 0.1–1.0)
Monocytes Relative: 13.6 % — ABNORMAL HIGH (ref 3.0–12.0)
Neutro Abs: 1.5 10*3/uL (ref 1.4–7.7)
Neutrophils Relative %: 41.2 % — ABNORMAL LOW (ref 43.0–77.0)
Platelets: 263 10*3/uL (ref 150.0–400.0)
RBC: 6.07 Mil/uL — ABNORMAL HIGH (ref 4.22–5.81)
RDW: 14.9 % (ref 11.5–15.5)
WBC: 3.7 10*3/uL — ABNORMAL LOW (ref 4.0–10.5)

## 2022-10-02 LAB — POCT URINALYSIS DIPSTICK
Bilirubin, UA: NEGATIVE
Blood, UA: POSITIVE
Glucose, UA: NEGATIVE
Ketones, UA: NEGATIVE
Leukocytes, UA: NEGATIVE
Nitrite, UA: NEGATIVE
Protein, UA: NEGATIVE
Spec Grav, UA: 1.025 (ref 1.010–1.025)
Urobilinogen, UA: 0.2 E.U./dL
pH, UA: 6 (ref 5.0–8.0)

## 2022-10-02 LAB — COMPREHENSIVE METABOLIC PANEL
ALT: 21 U/L (ref 0–53)
AST: 20 U/L (ref 0–37)
Albumin: 4.2 g/dL (ref 3.5–5.2)
Alkaline Phosphatase: 59 U/L (ref 39–117)
BUN: 12 mg/dL (ref 6–23)
CO2: 27 mEq/L (ref 19–32)
Calcium: 10 mg/dL (ref 8.4–10.5)
Chloride: 102 mEq/L (ref 96–112)
Creatinine, Ser: 1.5 mg/dL (ref 0.40–1.50)
GFR: 57.71 mL/min — ABNORMAL LOW (ref >60.00)
Glucose, Bld: 84 mg/dL (ref 70–99)
Potassium: 3.9 mEq/L (ref 3.5–5.1)
Sodium: 138 mEq/L (ref 135–145)
Total Bilirubin: 0.5 mg/dL (ref 0.2–1.2)
Total Protein: 7.2 g/dL (ref 6.0–8.3)

## 2022-10-02 LAB — PROTIME-INR
INR: 1 ratio (ref 0.8–1.0)
Prothrombin Time: 10.7 s (ref 9.6–13.1)

## 2022-10-02 LAB — T4, FREE: Free T4: 0.52 ng/dL — ABNORMAL LOW (ref 0.60–1.60)

## 2022-10-02 LAB — HEMOGLOBIN A1C: Hgb A1c MFr Bld: 6.2 % (ref 4.6–6.5)

## 2022-10-02 LAB — TSH: TSH: 0.91 u[IU]/mL (ref 0.35–5.50)

## 2022-10-02 NOTE — Progress Notes (Signed)
Established Patient Office Visit   Subjective  Patient ID: Michael Ramirez, male    DOB: Nov 18, 1980  Age: 42 y.o. MRN: [id number]  Chief Complaint  Patient presents with   Hematuria    Pt reports he had a physical exam for job in Jan., 2024. They found blood urine when collect lab, inform to follow up with PCP. Denied back pain.    Dizziness    Pt reports lightheaded when moving around a lot. He added he black out and fell last October. Denied injury.    Shortness of Breath    Pt reports he has noticed hard to breathe when laying down last summer.     Pt is a 42 yo male with pmh sig for anxiety who presents for f/u.  Pt states he was told to f/u with his pcp after a health at work screening UA showed hematuria.  Pt endorses prior h/o hematuria.  Previously seen by Urology.  Pt notes gross hematuria in January 2024, that eventually cleared up.  Pt also notes urine being more foamy, urinary frequency q 1 hr, nocturia.  Drinking 4-5 16 ounce bottles of water per day.  Patient notes prior episode of hematuria in 2022.  Had a syncopal episode in 2023 but contributes to not eating.  Denies family history of hematuria.    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   Hemorrhoid 09/17/2016   Cluster headaches 10/13/2013   Past Surgical History:  Procedure Laterality Date   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 Right 02/28/2020   Social History   Tobacco Use   Smoking status: Former    Types: Cigarettes    Quit date: 11/25/2015    Years since quitting: 6.9   Smokeless tobacco: Former  Building services engineer Use: Never used  Substance Use Topics   Alcohol use: Yes    Alcohol/week: 6.0 standard drinks of alcohol    Types: 6 Shots of liquor per week    Comment: Fifth  every other weekend    Drug use: Yes    Types: Marijuana   Family History  Problem Relation Age of Onset   Hypertension Father     Anxiety disorder Father    Heart disease Paternal Grandmother    Cancer Paternal Grandfather    Allergies  Allergen Reactions   Hydrocodone-Acetaminophen Rash   Oxycodone Rash      ROS Negative unless stated above    Objective:     BP 100/70 (Patient Position: Sitting)   Pulse 65   Temp 98.1 F (36.7 C)   Wt 182 lb 6.4 oz (82.7 kg)   SpO2 96%   BMI 28.57 kg/m  BP Readings from Last 3 Encounters:  07/14/21 (!) 128/96  07/10/21 127/84  11/10/20 118/80   Wt Readings from Last 3 Encounters:  07/14/21 162 lb 6.4 oz (73.7 kg)  11/10/20 174 lb (78.9 kg)  08/21/20 180 lb (81.6 kg)      Physical Exam Constitutional:      General: He is not in acute distress.    Appearance: Normal appearance.  HENT:     Head: Normocephalic and atraumatic.     Nose: Nose normal.     Mouth/Throat:     Mouth: Mucous membranes are moist.  Eyes:     Extraocular Movements: Extraocular movements intact.     Conjunctiva/sclera: Conjunctivae normal.     Pupils: Pupils are  equal, round, and reactive to light.  Cardiovascular:     Rate and Rhythm: Normal rate and regular rhythm.     Heart sounds: Normal heart sounds. No murmur heard.    No gallop.  Pulmonary:     Effort: Pulmonary effort is normal. No respiratory distress.     Breath sounds: Normal breath sounds. No wheezing, rhonchi or rales.  Abdominal:     General: Bowel sounds are normal.     Palpations: Abdomen is soft.     Tenderness: There is no right CVA tenderness or left CVA tenderness.  Skin:    General: Skin is warm and dry.  Neurological:     Mental Status: He is alert and oriented to person, place, and time.      Results for orders placed or performed in visit on 10/02/22  POC Urinalysis Dipstick  Result Value Ref Range   Color, UA yellow    Clarity, UA clear    Glucose, UA Negative Negative   Bilirubin, UA Negative    Ketones, UA Negative    Spec Grav, UA 1.025 1.010 - 1.025   Blood, UA Positive    pH, UA 6.0  5.0 - 8.0   Protein, UA Negative Negative   Urobilinogen, UA 0.2 0.2 or 1.0 E.U./dL   Nitrite, UA Negative    Leukocytes, UA Negative Negative   Appearance     Odor     .gad   Assessment & Plan:  Hematuria, unspecified type -     POCT urinalysis dipstick -     CBC with Differential/Platelet -     Comprehensive metabolic panel -     TSH -     T4, free -     Protime-INR -     Ambulatory referral to Urology  Syncope, unspecified syncope type -     Hemoglobin A1c  GAD (generalized anxiety disorder)  Concern for nephrotic syndrome.  RBCs noted in POC UA.  Given recurrent symptoms refer to urology for further workup.  Given strict precautions.  No follow-ups on file.   Deeann Saint, MD

## 2022-10-16 ENCOUNTER — Ambulatory Visit (INDEPENDENT_AMBULATORY_CARE_PROVIDER_SITE_OTHER): Payer: PRIVATE HEALTH INSURANCE | Admitting: Podiatry

## 2022-10-16 ENCOUNTER — Ambulatory Visit (INDEPENDENT_AMBULATORY_CARE_PROVIDER_SITE_OTHER): Payer: PRIVATE HEALTH INSURANCE

## 2022-10-16 DIAGNOSIS — R52 Pain, unspecified: Secondary | ICD-10-CM | POA: Diagnosis not present

## 2022-10-16 DIAGNOSIS — M778 Other enthesopathies, not elsewhere classified: Secondary | ICD-10-CM

## 2022-10-16 DIAGNOSIS — M7751 Other enthesopathy of right foot: Secondary | ICD-10-CM

## 2022-10-16 MED ORDER — MELOXICAM 15 MG PO TABS: 15.0000 mg | ORAL_TABLET | Freq: Every day | ORAL | 0 refills | Status: DC | PRN

## 2022-10-16 MED ORDER — TRIAMCINOLONE ACETONIDE 10 MG/ML IJ SUSP
10.0000 mg | Freq: Once | INTRAMUSCULAR | Status: AC
  Administered 2022-10-16: 10 mg

## 2022-10-16 NOTE — Patient Instructions (Signed)

## 2022-10-16 NOTE — Progress Notes (Unsigned)
Subjective: Chief Complaint  Patient presents with   Foot Pain    Pain located in the right foot, lateral side and midfoot, Pain has become worse, walking/standing causes pain to become worse   Denies any systemic complaints such as fevers, chills, nausea, vomiting. No acute changes since last appointment, and no other complaints at this time.   Objective: AAO x3, NAD DP/PT pulses palpable bilaterally, CRT less than 3 seconds Protective sensation intact with Simms Weinstein monofilament, vibratory sensation intact, Achilles tendon reflex intact No areas of pinpoint bony tenderness or pain with vibratory sensation. MMT 5/5, ROM WNL. No edema, erythema, increase in warmth to bilateral lower extremities.  No open lesions or pre-ulcerative lesions.  No pain with calf compression, swelling, warmth, erythema  Assessment:  Plan: -All treatment options discussed with the patient including all alternatives, risks, complications.   -Patient encouraged to call the office with any questions, concerns, change in symptoms.

## 2022-11-06 ENCOUNTER — Encounter: Payer: Self-pay | Admitting: Family Medicine

## 2023-03-02 ENCOUNTER — Other Ambulatory Visit: Payer: Self-pay | Admitting: Nurse Practitioner

## 2023-03-02 ENCOUNTER — Encounter: Payer: Self-pay | Admitting: Family Medicine

## 2023-03-02 ENCOUNTER — Ambulatory Visit: Payer: Self-pay

## 2023-03-02 DIAGNOSIS — Z021 Encounter for pre-employment examination: Secondary | ICD-10-CM

## 2023-03-06 NOTE — Telephone Encounter (Signed)
Ok

## 2023-06-28 ENCOUNTER — Ambulatory Visit: Payer: PRIVATE HEALTH INSURANCE | Admitting: Family Medicine

## 2023-07-15 ENCOUNTER — Ambulatory Visit (INDEPENDENT_AMBULATORY_CARE_PROVIDER_SITE_OTHER): Payer: PRIVATE HEALTH INSURANCE | Admitting: Family Medicine

## 2023-07-15 ENCOUNTER — Encounter: Payer: Self-pay | Admitting: Family Medicine

## 2023-07-15 VITALS — BP 136/70 | HR 78 | Temp 98.7°F | Ht 67.0 in | Wt 166.6 lb

## 2023-07-15 DIAGNOSIS — F411 Generalized anxiety disorder: Secondary | ICD-10-CM | POA: Diagnosis not present

## 2023-07-15 DIAGNOSIS — Z0000 Encounter for general adult medical examination without abnormal findings: Secondary | ICD-10-CM | POA: Diagnosis not present

## 2023-07-15 DIAGNOSIS — Z23 Encounter for immunization: Secondary | ICD-10-CM

## 2023-07-15 DIAGNOSIS — Z87448 Personal history of other diseases of urinary system: Secondary | ICD-10-CM | POA: Diagnosis not present

## 2023-07-15 LAB — POCT URINALYSIS DIPSTICK
Bilirubin, UA: NEGATIVE
Glucose, UA: NEGATIVE
Ketones, UA: NEGATIVE
Leukocytes, UA: NEGATIVE
Nitrite, UA: NEGATIVE
Protein, UA: POSITIVE — AB
Spec Grav, UA: 1.015
Urobilinogen, UA: 0.2 U/dL
pH, UA: 6.5

## 2023-07-15 LAB — CBC WITH DIFFERENTIAL/PLATELET
Basophils Absolute: 0 10*3/uL (ref 0.0–0.1)
Basophils Relative: 0.4 % (ref 0.0–3.0)
Eosinophils Absolute: 0.1 10*3/uL (ref 0.0–0.7)
Eosinophils Relative: 2.5 % (ref 0.0–5.0)
HCT: 47.9 % (ref 39.0–52.0)
Hemoglobin: 15.9 g/dL (ref 13.0–17.0)
Lymphocytes Relative: 41.3 % (ref 12.0–46.0)
Lymphs Abs: 1.7 10*3/uL (ref 0.7–4.0)
MCHC: 33.1 g/dL (ref 30.0–36.0)
MCV: 82.2 fL (ref 78.0–100.0)
Monocytes Absolute: 0.5 10*3/uL (ref 0.1–1.0)
Monocytes Relative: 13.2 % — ABNORMAL HIGH (ref 3.0–12.0)
Neutro Abs: 1.7 10*3/uL (ref 1.4–7.7)
Neutrophils Relative %: 42.6 % — ABNORMAL LOW (ref 43.0–77.0)
Platelets: 242 10*3/uL (ref 150.0–400.0)
RBC: 5.83 Mil/uL — ABNORMAL HIGH (ref 4.22–5.81)
RDW: 14.5 % (ref 11.5–15.5)
WBC: 4.1 10*3/uL (ref 4.0–10.5)

## 2023-07-15 LAB — T4, FREE: Free T4: 0.64 ng/dL (ref 0.60–1.60)

## 2023-07-15 LAB — COMPREHENSIVE METABOLIC PANEL
ALT: 22 U/L (ref 0–53)
AST: 17 U/L (ref 0–37)
Albumin: 4.5 g/dL (ref 3.5–5.2)
Alkaline Phosphatase: 66 U/L (ref 39–117)
BUN: 14 mg/dL (ref 6–23)
CO2: 29 meq/L (ref 19–32)
Calcium: 9.5 mg/dL (ref 8.4–10.5)
Chloride: 101 meq/L (ref 96–112)
Creatinine, Ser: 1.44 mg/dL (ref 0.40–1.50)
GFR: 60.27 mL/min (ref >60.00)
Glucose, Bld: 92 mg/dL (ref 70–99)
Potassium: 4.1 meq/L (ref 3.5–5.1)
Sodium: 138 meq/L (ref 135–145)
Total Bilirubin: 0.7 mg/dL (ref 0.2–1.2)
Total Protein: 7.1 g/dL (ref 6.0–8.3)

## 2023-07-15 LAB — LIPID PANEL
Cholesterol: 262 mg/dL — ABNORMAL HIGH (ref 0–200)
HDL: 55.6 mg/dL (ref >39.00)
LDL Cholesterol: 184 mg/dL — ABNORMAL HIGH (ref 0–99)
NonHDL: 206.01
Total CHOL/HDL Ratio: 5
Triglycerides: 109 mg/dL (ref 0.0–149.0)
VLDL: 21.8 mg/dL (ref 0.0–40.0)

## 2023-07-15 LAB — HEMOGLOBIN A1C: Hgb A1c MFr Bld: 6.2 % (ref 4.6–6.5)

## 2023-07-15 LAB — TSH: TSH: 1.5 u[IU]/mL (ref 0.35–5.50)

## 2023-07-15 MED ORDER — SERTRALINE HCL 25 MG PO TABS: 25.0000 mg | ORAL_TABLET | Freq: Every day | ORAL | 3 refills | Status: DC

## 2023-07-15 NOTE — Addendum Note (Signed)
Addended by: Philipp Deputy A on: 07/15/2023 01:23 PM   Modules accepted: Orders

## 2023-07-15 NOTE — Patient Instructions (Signed)
 Behavioral Health Services: -to make an appointment contact the office/provider you are interested in seeing.  No referral is needed.  The below is not an all inclusive list, but will help you get started.  [email or web address] -counseling located off of [address]  [email or web address] -website helps you find providers in your area  [facility] -Located off of [address]. across from [address]  Dr. Jannifer Franklin is a Therapist, sports with [facility]. [phone]  [facility] Counseling and wellness  [facility]  -[address]  [phone] -a place in town that has counseling and Psychiatry services.    [facility] [address] [place], [place] [phone]  [facility] [address] [place], [place] [phone]  [facility] [address] [place] [address] [phone]  Dr. Alanson Aly [facility] [address] [place], [place] [address] [phone]  Dr. Archer Asa [facility] [address] [place], [place] [address] [phone]

## 2023-07-15 NOTE — Progress Notes (Addendum)
Established Patient Office Visit   Subjective  Patient ID: Michael Ramirez, male    DOB: [birth year]  Age: 42 y.o. MRN: [id number]  Chief Complaint  Patient presents with   Annual Exam    Patient is a 42 year old male seen for CPE.  Patient states he has been doing well overall.  Has some concerns about mood.  Noticing becoming irritated for no reason.  Also having episodes of "increased energy" where he feels like he needs to go out and do something.  Will go to the store randomly and buy things.  Was gambling more than he should.  Denies being in debt due to behavior.  Was also drinking just because and smoking cigarettes if someone else had one which is not like him.   Pt notes his gf notices the changes in his behavior/increased irritability.  Pt endorses thoughts of his gf cheating on him or his kids not wanting him around without cause.  Patient recently found out his father is not biologically related.  Pt endorses difficulty falling asleep.  Denies being up for several days at a time.  In the past was on Zoloft but was not taking it consistently.  Patient interested in restarting.  States his daughter is on Zoloft.  Patient open to counseling.  At last visit patient noted to have hematuria.      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   Hemorrhoid 09/17/2016   Cluster headaches 10/13/2013   Past Medical History:  Diagnosis Date   Cluster headache    Past Surgical History:  Procedure Laterality Date   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 Right 02/28/2020   Social History   Tobacco Use   Smoking status: Former    Current packs/day: 0.00    Types: Cigarettes    Quit date: 11/25/2015    Years since quitting: 7.6   Smokeless tobacco: Former  Building services engineer status: Never Used  Substance Use Topics   Alcohol use: Yes    Alcohol/week: 6.0 standard drinks of alcohol    Types: 6  Shots of liquor per week    Comment: Fifth  every other weekend    Drug use: Yes    Types: Marijuana   Family History  Problem Relation Age of Onset   Hypertension Father    Anxiety disorder Father    Heart disease Paternal Grandmother    Cancer Paternal Grandfather    Allergies  Allergen Reactions   Hydrocodone-Acetaminophen Rash   Oxycodone Rash      ROS Negative unless stated above    Objective:     BP 136/70 (BP Location: Right Arm, Patient Position: Sitting, Cuff Size: Large)   Pulse 78   Temp 98.7 F (37.1 C) (Oral)   Ht 5\' 7"  (1.702 m)   Wt 166 lb 9.6 oz (75.6 kg)   SpO2 97%   BMI 26.09 kg/m    Physical Exam Constitutional:      Appearance: Normal appearance.  HENT:     Head: Normocephalic and atraumatic.     Right Ear: Tympanic membrane, ear canal and external ear normal.     Left Ear: Tympanic membrane, ear canal and external ear normal.     Nose: Nose normal.     Mouth/Throat:     Mouth: Mucous membranes are moist.     Pharynx: No oropharyngeal exudate or posterior oropharyngeal erythema.  Eyes:  General: No scleral icterus.    Extraocular Movements: Extraocular movements intact.     Conjunctiva/sclera: Conjunctivae normal.     Pupils: Pupils are equal, round, and reactive to light.  Neck:     Thyroid: No thyromegaly.  Cardiovascular:     Rate and Rhythm: Normal rate and regular rhythm.     Pulses: Normal pulses.     Heart sounds: Normal heart sounds. No murmur heard.    No friction rub.  Pulmonary:     Effort: Pulmonary effort is normal.     Breath sounds: Normal breath sounds. No wheezing, rhonchi or rales.  Abdominal:     General: Bowel sounds are normal.     Palpations: Abdomen is soft.     Tenderness: There is no abdominal tenderness.  Musculoskeletal:        General: No deformity. Normal range of motion.  Lymphadenopathy:     Cervical: No cervical adenopathy.  Skin:    General: Skin is warm and dry.     Findings: No lesion.   Neurological:     General: No focal deficit present.     Mental Status: He is alert and oriented to person, place, and time.  Psychiatric:        Mood and Affect: Mood normal.        Thought Content: Thought content normal.       07/15/2023   10:17 AM 10/02/2022    9:54 AM 07/14/2021    4:45 PM  Depression screen PHQ 2/9  Decreased Interest 0 0 2  Down, Depressed, Hopeless 0 1 2  PHQ - 2 Score 0 1 4  Altered sleeping 3 0 3  Tired, decreased energy 0 3 2  Change in appetite 0 3 3  Feeling bad or failure about yourself  1 0 2  Trouble concentrating 1 1 3   Moving slowly or fidgety/restless 0 0 1  Suicidal thoughts 0 0 0  PHQ-9 Score 5 8 18   Difficult doing work/chores Somewhat difficult Somewhat difficult Very difficult      07/15/2023   10:17 AM 10/02/2022   11:12 AM 07/14/2021    4:45 PM  GAD 7 : Generalized Anxiety Score  Nervous, Anxious, on Edge 2 1 3   Control/stop worrying 1 1 3   Worry too much - different things 1 1 3   Trouble relaxing 1 2 3   Restless 1 1 1   Easily annoyed or irritable 2 1 2   Afraid - awful might happen 0 1 3  Total GAD 7 Score 8 8 18   Anxiety Difficulty Somewhat difficult Somewhat difficult Very difficult      Results for orders placed or performed in visit on 07/15/23  POCT urinalysis dipstick  Result Value Ref Range   Color, UA yellow    Clarity, UA clear    Glucose, UA Negative Negative   Bilirubin, UA neg    Ketones, UA neg    Spec Grav, UA 1.015 1.010 - 1.025   Blood, UA 1+    pH, UA 6.5 5.0 - 8.0   Protein, UA Positive (A) Negative   Urobilinogen, UA 0.2 0.2 or 1.0 E.U./dL   Nitrite, UA neg    Leukocytes, UA Negative Negative   Appearance     Odor        Assessment & Plan:  Well adult exam -Age-appropriate health screenings discussed -Obtain labs -Immunizations reviewed.  Patient declines flu vaccine.  Tdap given this visit. -Next CPE in 1 year -  CBC with Differential/Platelet; Future -     Comprehensive metabolic  panel; Future -     Hemoglobin A1c; Future -     Lipid panel; Future -     TSH; Future -     T4, free; Future  Need for Tdap vaccination -     Tdap vaccine greater than or equal to 7yo IM  History of hematuria -For continued hematuria referral to urology. -     POCT urinalysis dipstick -     Urinalysis with micro -     Urine Culture  GAD (generalized anxiety disorder) -GAD-7 score 8 this visit.  PHQ-9 score 5 -Discussed patient's concerns for possible bipolar disorder.  Agree with further evaluation to differentiate. -Restart Zoloft 25 mg daily.  Patient to monitor for increased elevation in mood or changes in pending -Discussed counseling options.  Given list. -Follow-up in 4-6 weeks, sooner if needed. -     Sertraline HCl; Take 1 tablet (25 mg total) by mouth daily.  Dispense: 30 tablet; Refill: 3       -      TSH; Future       -       T4, Free; Future   Return in about 4 weeks (around 08/12/2023).   Deeann Saint, MD

## 2023-07-15 NOTE — Addendum Note (Signed)
Addended by: Abbe Amsterdam R on: 07/15/2023 04:42 PM   Modules accepted: Orders

## 2023-07-16 ENCOUNTER — Encounter: Payer: Self-pay | Admitting: Family Medicine

## 2023-07-16 ENCOUNTER — Other Ambulatory Visit: Payer: Self-pay | Admitting: Family Medicine

## 2023-07-16 DIAGNOSIS — R319 Hematuria, unspecified: Secondary | ICD-10-CM

## 2023-07-16 LAB — URINE CULTURE
MICRO NUMBER:: 15872016
SPECIMEN QUALITY:: ADEQUATE

## 2023-07-29 ENCOUNTER — Other Ambulatory Visit: Payer: Self-pay | Admitting: Family Medicine

## 2023-07-29 DIAGNOSIS — F411 Generalized anxiety disorder: Secondary | ICD-10-CM

## 2023-08-09 ENCOUNTER — Ambulatory Visit (INDEPENDENT_AMBULATORY_CARE_PROVIDER_SITE_OTHER): Payer: Medicaid Other | Admitting: Family Medicine

## 2023-08-09 ENCOUNTER — Encounter: Payer: Self-pay | Admitting: Family Medicine

## 2023-08-09 VITALS — BP 120/70 | HR 86 | Temp 98.8°F | Ht 67.0 in | Wt 168.6 lb

## 2023-08-09 DIAGNOSIS — Z87448 Personal history of other diseases of urinary system: Secondary | ICD-10-CM | POA: Diagnosis not present

## 2023-08-09 DIAGNOSIS — F411 Generalized anxiety disorder: Secondary | ICD-10-CM | POA: Diagnosis not present

## 2023-08-09 DIAGNOSIS — Z0120 Encounter for dental examination and cleaning without abnormal findings: Secondary | ICD-10-CM | POA: Diagnosis not present

## 2023-08-09 DIAGNOSIS — R7303 Prediabetes: Secondary | ICD-10-CM | POA: Diagnosis not present

## 2023-08-09 NOTE — Progress Notes (Signed)
 Established Patient Office Visit   Subjective  Patient ID: Michael Ramirez, male    DOB: 1980-08-02  Age: 43 y.o. MRN: [id number]  Chief Complaint  Patient presents with   Medical Management of Chronic Issues  Patient accompanied by his girlfriend.  Pt is a 43 yo male seen for f/u on anxiety and prediabetes.  A1C was 6.2% on 07/15/23.  Pt states he is feeling better.  He did not start taking zoloft  after last OFV.  Pt started exercising, eating better/less fast food, and drinking more water which has been helpful.  Pt started taking glucose support supplement from natural food store to help with A1C.  Pt's sig other states she has noticed less mood swings.  Pt working on sleep hygiene, plans to read before bed and decrease screen time.  Pt hoping to take advantage of EAP in the next few months once full time at his current job.  Since making changes, bp has improved.  Pt was contacted by Urology about appt for hematuria, but missed the call as was at work.  Plans to call Urology office back to set up appt.    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   Hemorrhoid 09/17/2016   Cluster headaches 10/13/2013   Past Medical History:  Diagnosis Date   Cluster headache    Past Surgical History:  Procedure Laterality Date   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 Right 02/28/2020   Social History   Tobacco Use   Smoking status: Former    Current packs/day: 0.00    Types: Cigarettes    Quit date: 11/25/2015    Years since quitting: 7.7   Smokeless tobacco: Former  Building Services Engineer status: Never Used  Substance Use Topics   Alcohol use: Yes    Alcohol/week: 6.0 standard drinks of alcohol    Types: 6 Shots of liquor per week    Comment: Fifth  every other weekend    Drug use: Yes    Types: Marijuana   Family History  Problem Relation Age of Onset   Hypertension Father    Anxiety disorder  Father    Heart disease Paternal Grandmother    Cancer Paternal Grandfather    Allergies  Allergen Reactions   Hydrocodone -Acetaminophen  Rash   Oxycodone  Rash      ROS Negative unless stated above    Objective:     BP 120/70 (BP Location: Left Arm, Patient Position: Sitting, Cuff Size: Large)   Pulse 86   Temp 98.8 F (37.1 C) (Oral)   Ht 5' 7 (1.702 m)   Wt 168 lb 9.6 oz (76.5 kg)   SpO2 97%   BMI 26.41 kg/m  BP Readings from Last 3 Encounters:  08/09/23 120/70  07/15/23 136/70  10/02/22 100/70   Wt Readings from Last 3 Encounters:  08/09/23 168 lb 9.6 oz (76.5 kg)  07/15/23 166 lb 9.6 oz (75.6 kg)  10/02/22 182 lb 6.4 oz (82.7 kg)      Physical Exam Constitutional:      General: He is not in acute distress.    Appearance: Normal appearance.  HENT:     Head: Normocephalic and atraumatic.     Nose: Nose normal.     Mouth/Throat:     Mouth: Mucous membranes are moist.  Cardiovascular:     Rate and Rhythm: Normal rate and regular rhythm.     Heart sounds:  Normal heart sounds. No murmur heard.    No gallop.  Pulmonary:     Effort: Pulmonary effort is normal. No respiratory distress.     Breath sounds: Normal breath sounds. No wheezing, rhonchi or rales.  Skin:    General: Skin is warm and dry.  Neurological:     Mental Status: He is alert and oriented to person, place, and time.       07/15/2023   10:17 AM 10/02/2022    9:54 AM 07/14/2021    4:45 PM  Depression screen PHQ 2/9  Decreased Interest 0 0 2  Down, Depressed, Hopeless 0 1 2  PHQ - 2 Score 0 1 4  Altered sleeping 3 0 3  Tired, decreased energy 0 3 2  Change in appetite 0 3 3  Feeling bad or failure about yourself  1 0 2  Trouble concentrating 1 1 3   Moving slowly or fidgety/restless 0 0 1  Suicidal thoughts 0 0 0  PHQ-9 Score 5 8 18   Difficult doing work/chores Somewhat difficult Somewhat difficult Very difficult      07/15/2023   10:17 AM 10/02/2022   11:12 AM 07/14/2021    4:45  PM  GAD 7 : Generalized Anxiety Score  Nervous, Anxious, on Edge 2 1 3   Control/stop worrying 1 1 3   Worry too much - different things 1 1 3   Trouble relaxing 1 2 3   Restless 1 1 1   Easily annoyed or irritable 2 1 2   Afraid - awful might happen 0 1 3  Total GAD 7 Score 8 8 18   Anxiety Difficulty Somewhat difficult Somewhat difficult Very difficult      No results found for any visits on 08/09/23.    Assessment & Plan:  GAD (generalized anxiety disorder) -Improving per patient -Will discontinue Zoloft  as patient never started medication. -Continue lifestyle modifications, including exercising, self-care, journaling, decreasing screen time, etc. -Patient made goal of reading before bed to help with sleep -EAP when eligible.  Has list of other BH providers if needed. -Will have patient follow-up in the next 3 months, sooner if needed  History of hematuria -Recurrent asymptomatic hematuria noted on UA at last OFV on 07/15/2023. Patient missed call from urology office.  Advised to call back to set up appointment  Prediabetes -Hemoglobin A1c 6.2% on 07/15/2023 -Continue lifestyle modifications -Continue to monitor  Return in about 3 months (around 11/07/2023).   Clotilda JONELLE Single, MD

## 2023-08-23 DIAGNOSIS — Z0120 Encounter for dental examination and cleaning without abnormal findings: Secondary | ICD-10-CM | POA: Diagnosis not present

## 2023-08-25 DIAGNOSIS — Z0120 Encounter for dental examination and cleaning without abnormal findings: Secondary | ICD-10-CM | POA: Diagnosis not present

## 2023-09-30 ENCOUNTER — Ambulatory Visit: Payer: Self-pay | Admitting: Family Medicine

## 2023-09-30 ENCOUNTER — Telehealth: Payer: Self-pay | Admitting: *Deleted

## 2023-09-30 NOTE — Telephone Encounter (Signed)
 Called and spoke with patient, patient ask to give him a call back, called patient back at a later time,  left a VM to return call, patient would need to be sch to see Dr. Salomon Fick

## 2023-09-30 NOTE — Telephone Encounter (Signed)
 Patient needs an appt.

## 2023-09-30 NOTE — Telephone Encounter (Signed)
 Copied from CRM [id number]. Topic: Referral - Question >> Sep 30, 2023 12:53 PM Jon Gills C wrote: Reason for CRM: Patient called in stating he hurt his finger, wanted to know if Dr.Banks can order a xray on his finger is requesting a callback about this matter

## 2023-09-30 NOTE — Telephone Encounter (Signed)
 Copied from CRM [id number]. Topic: Clinical - Red Word Triage >> Sep 30, 2023  3:57 PM Sim Boast F wrote: Red Word that prompted transfer to Nurse Triage: Patient middle finger painful 7/10 and cannot close it all the way - would like an x-ray   Chief Complaint: swollen right middle fiber knuckle Symptoms: also had some wrist swelling and referred ptain to middle of palm Frequency: 09/20/23 [facility] [place] comp claim Pertinent Negatives: Patient denies redness or paleness of finger.  Disposition: [] ED /[] Urgent Care (no appt availability in office) / [x] Appointment(In office/virtual)/ []  [facility]/ [] Home Care/ [] Refused Recommended Disposition /[] [facility]/ []  Follow-up with PCP Additional Notes:   Reason for Disposition  [1] MODERATE pain (e.g., interferes with normal activities) AND [2] present > 3 days  Answer Assessment - Initial Assessment Questions 1. ONSET: "When did the pain start?"      09/20/23 2. LOCATION and RADIATION: "Where is the pain located?"  (e.g., fingertip, around nail, joint, entire  finger)      Right  3. SEVERITY: "How bad is the pain?" "What does it keep you from doing?"   (Scale 1-10; or mild, moderate, severe)  - MILD (1-3): doesn't interfere with normal activities.   - MODERATE (4-7): interferes with normal activities or awakens from sleep.  - SEVERE (8-10): excruciating pain, unable to hold a glass of water or bend finger even a little.     6/10 4. APPEARANCE: "What does the finger look like?" (e.g., redness, swelling, bruising, pallor)     Swollen at the [facility] and looks crooked from the knuckle up- lateral and curved to the left 5. WORK OR EXERCISE: "Has there been any recent work or exercise that involved this part (i.e., fingers or hand) of the body?"     work 6. CAUSE: "What do you think is causing the pain?"     tendon 7. AGGRAVATING FACTORS: "What makes the pain worse?" (e.g., using computer)     Bending finger  8. OTHER SYMPTOMS: "Do  you have any other symptoms?" (e.g., fever, neck pain, numbness)     no Top of finger down to middle of hand and right wrist  medial pain  Protocols used: Finger Pain-A-AH

## 2023-10-01 ENCOUNTER — Encounter: Payer: Self-pay | Admitting: Family Medicine

## 2023-10-01 ENCOUNTER — Ambulatory Visit (INDEPENDENT_AMBULATORY_CARE_PROVIDER_SITE_OTHER): Admitting: Family Medicine

## 2023-10-01 VITALS — BP 142/74 | HR 99 | Temp 97.2°F | Ht 67.0 in | Wt 170.8 lb

## 2023-10-01 DIAGNOSIS — M79644 Pain in right finger(s): Secondary | ICD-10-CM

## 2023-10-01 DIAGNOSIS — G44019 Episodic cluster headache, not intractable: Secondary | ICD-10-CM | POA: Diagnosis not present

## 2023-10-01 DIAGNOSIS — S63632D Sprain of interphalangeal joint of right middle finger, subsequent encounter: Secondary | ICD-10-CM | POA: Diagnosis not present

## 2023-10-01 NOTE — Progress Notes (Signed)
 Established Patient Office Visit   Subjective  Patient ID: Michael Ramirez, male    DOB: 11-Jun-1981  Age: 43 y.o. MRN: [id number]  Chief Complaint  Patient presents with   Hand Pain    Right Middle finger swelling and pain, started Feb 24th at work, patient hit his hand on a metal tray, patient is doing PT and has had an x-ray done of the hand, patient rates the pain a 6 out of 10, aches, throbbing,shooting    Headache    Headaches on the right side of the head, 12hr    Patient is a 43 year old male seen for ongoing injury.  Patient endorses injuring right middle finger while pulling a metal tray at work on 09/20/2023.  Patient states he was sent to [facility] urgent care on [address] after injury.  Was advised that his finger was sprained and put on light duty.  Patient presents today inquiring about MRI.  Patient states finger feels full and numb.  Able to bend.  Patient endorses sharp throbbing, beating headache on right side of head Wednesday/Thursday.  Has noticed twice per day around 10 AM and 9 PM.  Will take Tylenol or ibuprofen.  Patient denies intake of caffeine and sugar.  Drinking 5 bottles of water per day.    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   Hemorrhoid 09/17/2016   Cluster headaches 10/13/2013   Past Medical History:  Diagnosis Date   Cluster headache    Past Surgical History:  Procedure Laterality Date   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 Right 02/28/2020   Social History   Tobacco Use   Smoking status: Former    Current packs/day: 0.00    Types: Cigarettes    Quit date: 11/25/2015    Years since quitting: 7.8   Smokeless tobacco: Former  Building services engineer status: Never Used  Substance Use Topics   Alcohol use: Yes    Alcohol/week: 6.0 standard  drinks of alcohol    Types: 6 Shots of liquor per week    Comment: Fifth  every other weekend    Drug use: Yes    Types: Marijuana   Family History  Problem Relation Age of Onset   Hypertension Father    Anxiety disorder Father    Heart disease Paternal Grandmother    Cancer Paternal Grandfather    Allergies  Allergen Reactions   Hydrocodone-Acetaminophen Rash   Oxycodone Rash      ROS Negative unless stated above    Objective:     BP (!) 140/74 (BP Location: Left Arm, Patient Position: Sitting, Cuff Size: Normal)   Pulse 99   Temp (!) 97.2 F (36.2 C) (Oral)   Ht 5\' 7"  (1.702 m)   Wt 170 lb 12.8 oz (77.5 kg)   SpO2 99%   BMI 26.75 kg/m  BP Readings from Last 3 Encounters:  10/01/23 (!) 142/74  08/09/23 120/70  07/15/23 136/70   Wt Readings from Last 3 Encounters:  10/01/23 170 lb 12.8 oz (77.5 kg)  08/09/23 168 lb 9.6 oz (76.5 kg)  07/15/23 166 lb 9.6 oz (75.6 kg)      Physical Exam Constitutional:      General: He is not in acute distress.    Appearance: Normal appearance.  HENT:  Head: Normocephalic and atraumatic.     Nose: Nose normal.     Mouth/Throat:     Mouth: Mucous membranes are moist.  Cardiovascular:     Rate and Rhythm: Normal rate and regular rhythm.     Heart sounds: Normal heart sounds. No murmur heard.    No gallop.  Pulmonary:     Effort: Pulmonary effort is normal. No respiratory distress.     Breath sounds: Normal breath sounds. No wheezing, rhonchi or rales.  Musculoskeletal:     Right hand: Swelling present.     Left hand: Normal.     Comments: FROM of digits of b/l hands.  Edema of R 3rd digit at PIP jt. No discoloration, normal cap refill.    Skin:    General: Skin is warm and dry.  Neurological:     Mental Status: He is alert and oriented to person, place, and time.    No results found for any visits on 10/01/23.    Assessment & Plan:  Sprain of interphalangeal joint of right middle finger, subsequent  encounter  Pain of finger of right hand  Episodic cluster headache, not intractable  Acute sprain of R 3rd digit due to work related injury.  Pt advised to continue supportive care and exercises.  As workman's comp related discussed f/u with workman's comp provider at [facility].  No imaging needed at this time given intact ROM and no acute concern for fx.  Pt acute HAs.  Has h/o cluster HAs.   Discussed HA prevention and treatment options.  For continued sx, referral to neurology.  Return if symptoms worsen or fail to improve.   Deeann Saint, MD

## 2023-10-01 NOTE — Patient Instructions (Signed)
 GENERAL HEADACHE INSTRUCTIONS Headache Preventive Treatment: Please keep in mind that it takes 4-6 weeks for the medication to start working well and 2-3 months at the appropriate dose before deciding if it will be useful or not. If it is not helping at all by this time, then we will discuss other medications to try. Supplements may take 3-6 months until you see full effect.    Natural supplements: Magnesium Oxide or Magnesium Glycinate 500 mg at bed (up to 800 mg daily) Coenzyme Q10 300 mg in AM Vitamin B2- 200 mg twice a day   Add 1 supplement at a time since even natural supplements can have undesirable side effects. You can sometimes buy supplements cheaper (especially Coenzyme Q10) at www.WebmailGuide.co.za or at [employer].   Vitamins and herbs that show potential:   Magnesium: Magnesium (250 mg twice a day or 500 mg at bed) has a relaxant effect on smooth muscles such as blood vessels. Individuals suffering from frequent or daily headache usually have low magnesium levels which can be increase with daily supplementation of 400-750 mg. Three trials found 40-90% average headache reduction  when used as a preventative. Magnesium also demonstrated the benefit in menstrually related migraine.  Magnesium is part of the messenger system in the serotonin cascade and it is a good muscle relaxant.  It is also useful for constipation which can be a side effect of other medications used to treat migraine. Good sources include nuts, whole grains, and tomatoes. Side Effects: loose stool/diarrhea Riboflavin (vitamin B 2) 200 mg twice a day. This vitamin assists nerve cells in the production of ATP a principal energy storing molecule.  It is necessary for many chemical reactions in the body.  There have been at least 3 clinical trials of riboflavin using 400 mg per day all of which suggested that migraine frequency can be decreased.  All 3 trials showed significant improvement in over half of migraine sufferers.  The  supplement is found in bread, cereal, milk, meat, and poultry.  Most Americans get more riboflavin than the recommended daily allowance, however riboflavin deficiency is not necessary for the supplements to help prevent headache. Side effects: energizing, green urine   Coenzyme Q10: This is present in almost all cells in the body and is critical component for the conversion of energy.  Recent studies have shown that a nutritional supplement of CoQ10 can reduce the frequency of migraine attacks by improving the energy production of cells as with riboflavin.  Doses of 150 mg twice a day have been shown to be effective.   Melatonin: Increasing evidence shows correlation between melatonin secretion and headache conditions.  Melatonin supplementation has decreased headache intensity and duration.  It is widely used as a sleep aid.  Sleep is natures way of dealing with migraine.  A dose of 3 mg is recommended to start for headaches including cluster headache. Higher doses up to 15 mg has been reviewed for use in Cluster headache and have been used. The rationale behind using melatonin for cluster is that many theories regarding the cause of Cluster headache center around the disruption of the normal circadian rhythm in the brain.  This helps restore the normal circadian rhythm.     HEADACHE DIET: Foods and beverages which may trigger migraine Note that only 20% of headache patients are food sensitive. You will know if you are food sensitive if you get a headache consistently 20 minutes to 2 hours after eating a certain food. Only cut out a food  if it causes headaches, otherwise you might remove foods you enjoy! What matters most for diet is to eat a well balanced healthy diet full of vegetables and low fat protein, and to not miss meals.   Chocolate, other sweets ALL cheeses except cottage and cream cheese Dairy products, yogurt, sour cream, ice cream Liver Meat extracts (Bovril, Marmite, meat  tenderizers) Meats or fish which have undergone aging, fermenting, pickling or smoking. These include: Hotdogs,salami,Lox,sausage, mortadellas,smoked salmon, pepperoni, Pickled herring Pods of broad bean (English beans, Chinese pea pods, [place] (fava) beans, lima and navy beans Ripe avocado, ripe banana Yeast extracts or active yeast preparations such as Brewer's or Fleishman's (commercial bakes goods are permitted) Tomato based foods, pizza (lasagna, etc.)   MSG (monosodium glutamate) is disguised as many things; look for these common aliases: Monopotassium glutamate Autolysed yeast Hydrolysed protein Sodium caseinate "flavorings" "all natural preservatives" Nutrasweet   Avoid all other foods that convincingly provoke headaches.   Resources: The Dizzy [place]air Laundry Your Headache Diet, migrainestrong.com  https://zamora-andrews.com/  Headache Prevention Strategies:   1. Maintain a headache diary; learn to identify and avoid triggers.  - This can be a simple note where you log when you had a headache, associated symptoms, and medications used - There are several smartphone apps developed to help track migraines: Migraine Buddy, Migraine Monitor, Curelator N1-Headache App   Common triggers include: Emotional triggers: Emotional/Upset family or friends Emotional/Upset occupation Business reversal/success Anticipation anxiety Crisis-serious Post-crisis periodNew job/position   Physical triggers: Vacation Day Weekend Strenuous Exercise High Altitude Location New Move Menstrual Day Physical Illness Oversleep/Not enough sleep Weather changes Light: Photophobia or light sesnitivity treatment involves a balance between desensitization and reduction in overly strong input. Use dark polarized glasses outside, but not inside. Avoid bright or fluorescent light, but do not dim environment to the point that going into a normally lit room  hurts. Consider FL-41 tint lenses, which reduce the most irritating wavelengths without blocking too much light.  These can be obtained at axonoptics.com or theraspecs.com Foods: see list above.   2. Limit use of acute treatments (over-the-counter medications, triptans, etc.) to no more than 2 days per week or 10 days per month to prevent medication overuse headache (rebound headache).     3. Follow a regular schedule (including weekends and holidays): Don't skip meals. Eat a balanced diet. 8 hours of sleep nightly. Minimize stress. Exercise 30 minutes per day. Being overweight is associated with a 5 times increased risk of chronic migraine. Keep well hydrated and drink 6-8 glasses of water per day.   4. Initiate non-pharmacologic measures at the earliest onset of your headache. Rest and quiet environment. Relax and reduce stress. Breathe2Relax is a free app that can instruct you on    some simple relaxtion and breathing techniques. Http://Dawnbuse.com is a    free website that provides teaching videos on relaxation.  Also, there are  many apps that   can be downloaded for "mindful" relaxation.  An app called YOGA NIDRA will help walk you through mindfulness. Another app called Calm can be downloaded to give you a structured mindfulness guide with daily reminders and skill development. Headspace for guided meditation Mindfulness Based Stress Reduction Online Course: www.palousemindfulness.com Cold compresses.   5. Don't wait!! Take the maximum allowable dosage of prescribed medication at the first sign of migraine.   6. Compliance:  Take prescribed medication regularly as directed and at the first sign of a migraine.   7. Communicate:  Call your physician when  problems arise, especially if your headaches change, increase in frequency/severity, or become associated with neurological symptoms (weakness, numbness, slurred speech, etc.).   8. Headache/pain management therapies: Consider various  complementary methods, including medication, behavioral therapy, psychological counselling, biofeedback, massage therapy, acupuncture, dry needling, and other modalities.  Such measures may reduce the need for medications. Counseling for pain management, where patients learn to function and ignore/minimize their pain, seems to work very well.   9. Recommend changing family's attention and focus away from patient's headaches. Instead, emphasize daily activities. If first question of day is 'How are your headaches/Do you have a headache today?', then patient will constantly think about headaches, thus making them worse. Goal is to re-direct attention away from headaches, toward daily activities and other distractions.   10. Helpful Websites: www.AmericanHeadacheSociety.org PatentHood.ch www.headaches.org TightMarket.nl www.achenet.org   11. HEADACHE EXPECTATIONS: There are many types of headaches, and only a rare few in which complete relief can be expected.  In general, there is no cure for headache, especially migraine based headaches.  There is nothing available that completely prevents headaches from occurring, breaking through, or having periodic flare-ups and fluctuations.  Regardless of what you are using on a daily basis for prevention, episodic headaches should still be expected, and periods where frequency may escalate and fluctuate are unavoidable.

## 2023-10-18 ENCOUNTER — Other Ambulatory Visit: Payer: Self-pay | Admitting: Occupational Medicine

## 2023-10-18 DIAGNOSIS — S63619A Unspecified sprain of unspecified finger, initial encounter: Secondary | ICD-10-CM

## 2023-10-28 ENCOUNTER — Ambulatory Visit
Admission: RE | Admit: 2023-10-28 | Discharge: 2023-10-28 | Disposition: A | Discharge status: Home / Self Care | Source: Ambulatory Visit | Attending: Occupational Medicine | Admitting: Occupational Medicine

## 2023-10-28 DIAGNOSIS — S63619A Unspecified sprain of unspecified finger, initial encounter: Secondary | ICD-10-CM

## 2023-10-30 ENCOUNTER — Other Ambulatory Visit

## 2023-11-22 ENCOUNTER — Ambulatory Visit: Payer: Self-pay | Admitting: Podiatry

## 2024-04-07 ENCOUNTER — Encounter: Payer: Self-pay | Admitting: Family Medicine

## 2024-04-07 ENCOUNTER — Ambulatory Visit (INDEPENDENT_AMBULATORY_CARE_PROVIDER_SITE_OTHER): Admitting: Family Medicine

## 2024-04-07 VITALS — BP 110/80 | HR 91 | Temp 98.3°F | Ht 67.0 in | Wt 170.2 lb

## 2024-04-07 DIAGNOSIS — R7303 Prediabetes: Secondary | ICD-10-CM

## 2024-04-07 DIAGNOSIS — Z113 Encounter for screening for infections with a predominantly sexual mode of transmission: Secondary | ICD-10-CM | POA: Diagnosis not present

## 2024-04-07 DIAGNOSIS — F411 Generalized anxiety disorder: Secondary | ICD-10-CM

## 2024-04-07 LAB — HEMOGLOBIN A1C: Hgb A1c MFr Bld: 6.4 % (ref 4.6–6.5)

## 2024-04-07 MED ORDER — SERTRALINE HCL 25 MG PO TABS: ORAL_TABLET | ORAL | 0 refills | Status: DC

## 2024-04-07 NOTE — Progress Notes (Addendum)
 Established Patient Office Visit   Subjective  Patient ID: Michael Ramirez, male    DOB: 1981/02/22  Age: 43 y.o. MRN: [id number]  Chief Complaint  Patient presents with   Medical Management of Chronic Issues    Medication- zoloft  and STD testing     Pt is a 43 yo male seen for ongoing concern.  Pt feels like he wants to restart SSRI as he did not give it much of a chance before.  Had an incident at a grocery store where he felt increased anxiety.  Playing chess on his phone to de-stress.    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   Hemorrhoid 09/17/2016   Cluster headaches 10/13/2013   Past Medical History:  Diagnosis Date   Cluster headache    Past Surgical History:  Procedure Laterality Date   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 Right 02/28/2020   Social History   Tobacco Use   Smoking status: Former    Current packs/day: 0.00    Types: Cigarettes    Quit date: 11/25/2015    Years since quitting: 8.3   Smokeless tobacco: Former  Building services engineer status: Never Used  Substance Use Topics   Alcohol use: Yes    Alcohol/week: 6.0 standard drinks of alcohol    Types: 6 Shots of liquor per week    Comment: Fifth  every other weekend    Drug use: Yes    Types: Marijuana   Family History  Problem Relation Age of Onset   Hypertension Father    Anxiety disorder Father    Heart disease Paternal Grandmother    Cancer Paternal Grandfather    Allergies  Allergen Reactions   Hydrocodone -Acetaminophen  Rash   Oxycodone  Rash    ROS Negative unless stated above    Objective:     BP 110/80 (BP Location: Left Arm, Patient Position: Sitting, Cuff Size: Large)   Pulse 91   Temp 98.3 F (36.8 C) (Oral)   Ht 5' 7 (1.702 m)   Wt 170 lb 3.2 oz (77.2 kg)   SpO2 98%    BMI 26.66 kg/m  BP Readings from Last 3 Encounters:  04/07/24 110/80  10/01/23 (!) 142/74  08/09/23 120/70   Wt Readings from Last 3 Encounters:  04/07/24 170 lb 3.2 oz (77.2 kg)  10/01/23 170 lb 12.8 oz (77.5 kg)  08/09/23 168 lb 9.6 oz (76.5 kg)      Physical Exam Constitutional:      General: He is not in acute distress.    Appearance: Normal appearance.  HENT:     Head: Normocephalic and atraumatic.     Nose: Nose normal.     Mouth/Throat:     Mouth: Mucous membranes are moist.  Cardiovascular:     Rate and Rhythm: Normal rate and regular rhythm.     Heart sounds: Normal heart sounds. No murmur heard.    No gallop.  Pulmonary:     Effort: Pulmonary effort is normal. No respiratory distress.     Breath sounds: Normal breath sounds. No wheezing, rhonchi or rales.  Skin:    General: Skin is warm and dry.  Neurological:     Mental Status: He is alert and oriented to person, place, and time.        04/07/2024  10:33 AM 10/01/2023    9:46 AM 07/15/2023   10:17 AM  Depression screen PHQ 2/9  Decreased Interest 1 0 0  Down, Depressed, Hopeless 0 0 0  PHQ - 2 Score 1 0 0  Altered sleeping 1 1 3   Tired, decreased energy 0 1 0  Change in appetite 1 1 0  Feeling bad or failure about yourself  1 0 1  Trouble concentrating 1 0 1  Moving slowly or fidgety/restless 0 0 0  Suicidal thoughts 0 0 0  PHQ-9 Score 5 3 5   Difficult doing work/chores Not difficult at all Not difficult at all Somewhat difficult      04/07/2024   10:33 AM 10/01/2023    9:46 AM 07/15/2023   10:17 AM 10/02/2022   11:12 AM  GAD 7 : Generalized Anxiety Score  Nervous, Anxious, on Edge 1 0 2 1  Control/stop worrying 1 0 1 1  Worry too much - different things 1 0 1 1  Trouble relaxing 0 0 1 2  Restless 0 0 1 1  Easily annoyed or irritable 1 1 2 1   Afraid - awful might happen 0 0 0 1  Total GAD 7 Score 4 1 8 8   Anxiety Difficulty Not difficult at all Not difficult at all Somewhat difficult Somewhat  difficult     No results found for any visits on 04/07/24.    Assessment & Plan:   GAD (generalized anxiety disorder) -     Sertraline  HCl; Take 1 tab (25 mg) daily x 2 wks.  If needed ok to increase to 2 tabs (50 mg) daily after the 2 weeks  Dispense: 90 tablet; Refill: 0  Prediabetes -     Hemoglobin A1c; Future  Routine screening for STI (sexually transmitted infection) -     RPR; Future -     HIV Antibody (routine testing w rflx); Future -     HSV 1 and 2 Ab, IgG; Future   GAD 7 score 4 and PHQ 9 score 5 this visit.  Given subjective increase in anxiety symptoms pt wishes to restart zoloft .  Will restart zoloft  25 mg daily x 2 wks.  Pt advised ok to increase to 50 mg if needed after the 2 wks.  Self care encouraged.  Consider counseling.  F/u in 4-6 wks, sooner if needed.  Return in about 6 weeks (around 05/19/2024).   Clotilda JONELLE Single, MD

## 2024-04-07 NOTE — Addendum Note (Signed)
 Addended by: MERCER KIRSCH R on: 04/07/2024 10:56 AM   Modules accepted: Level of Service

## 2024-04-08 LAB — HSV 1 AND 2 AB, IGG
HSV 1 Glycoprotein G Ab, IgG: NONREACTIVE
HSV 2 IgG, Type Spec: NONREACTIVE

## 2024-04-08 LAB — HIV ANTIBODY (ROUTINE TESTING W REFLEX): HIV Screen 4th Generation wRfx: NONREACTIVE

## 2024-04-08 LAB — RPR: RPR Ser Ql: NONREACTIVE

## 2024-04-12 ENCOUNTER — Ambulatory Visit: Payer: Self-pay | Admitting: Family Medicine

## 2024-04-27 ENCOUNTER — Encounter: Payer: Self-pay | Admitting: Family Medicine

## 2024-04-27 ENCOUNTER — Ambulatory Visit: Payer: Self-pay

## 2024-04-27 NOTE — Telephone Encounter (Signed)
 Michael Ramirez has appt 10/3

## 2024-04-27 NOTE — Telephone Encounter (Signed)
 Appointment made for 04/28/2024 at 11:30am with patient's PCP Dr Clotilda Single   FYI Only or Action Required?: FYI only for provider.  Patient was last seen in primary care on 04/07/2024 by Single Clotilda SAUNDERS, MD.  Called Nurse Triage reporting Cough.  Symptoms began 2 weeks ago.  Interventions attempted: OTC medications: Mucinex, Rest, hydration, or home remedies, and Other: honey, tea, cough drops.  Symptoms are: unchanged.  Triage Disposition: See Physician Within 24 Hours  Patient/caregiver understands and will follow disposition?: Yes              Copied from CRM [id number]. Topic: Clinical - Red Word Triage >> Apr 27, 2024  1:01 PM Shereese L wrote: [facility] that prompted transfer to Nurse Triage: wheezing, congestion, tip of nose is sensitive to touch and possible respiratory infection Reason for Disposition  [1] Continuous (nonstop) coughing interferes with work or school AND [2] no improvement using cough treatment per Care Advice  Answer Assessment - Initial Assessment Questions Patient is advised that if anything worsens to go to the Emergency Room. Patient verbalized understanding.   1. ONSET: When did the cough begin?      About a week ago 2. SEVERITY: How bad is the cough today?      Worse at night when laying down 3. SPUTUM: Describe the color of your sputum (e.g., none, dry cough; clear, white, yellow, green)     Dark yellow sometimes at night 4. HEMOPTYSIS: Are you coughing up any blood? If Yes, ask: How much? (e.g., flecks, streaks, tablespoons, etc.)     no 5. DIFFICULTY BREATHING: Are you having difficulty breathing? If Yes, ask: How bad is it? (e.g., mild, moderate, severe)      Only noticing it a little bit when he lays down--states he can't talk long without having to cough 6. FEVER: Do you have a fever? If Yes, ask: What is your temperature, how was it measured, and when did it start?     no 7. CARDIAC HISTORY: Do you have any  history of heart disease? (e.g., heart attack, congestive heart failure)      no 8. LUNG HISTORY: Do you have any history of lung disease?  (e.g., pulmonary embolus, asthma, emphysema)     Patient denies 9. PE RISK FACTORS: Do you have a history of blood clots? (or: recent major surgery, recent prolonged travel, bedridden)     no 10. OTHER SYMPTOMS: Do you have any other symptoms? (e.g., runny nose, wheezing, chest pain)       Pt states wheezing when he sleeps  Protocols used: Cough - Acute Non-Productive-A-AH

## 2024-04-28 ENCOUNTER — Ambulatory Visit (INDEPENDENT_AMBULATORY_CARE_PROVIDER_SITE_OTHER): Admitting: Family Medicine

## 2024-04-28 ENCOUNTER — Encounter: Payer: Self-pay | Admitting: Family Medicine

## 2024-04-28 VITALS — BP 124/82 | HR 80 | Temp 98.4°F | Ht 67.0 in | Wt 172.0 lb

## 2024-04-28 DIAGNOSIS — R0982 Postnasal drip: Secondary | ICD-10-CM

## 2024-04-28 DIAGNOSIS — F411 Generalized anxiety disorder: Secondary | ICD-10-CM | POA: Diagnosis not present

## 2024-04-28 DIAGNOSIS — J40 Bronchitis, not specified as acute or chronic: Secondary | ICD-10-CM

## 2024-04-28 MED ORDER — AMOXICILLIN-POT CLAVULANATE 500-125 MG PO TABS: 1.0000 | ORAL_TABLET | Freq: Two times a day (BID) | ORAL | 0 refills | Status: AC

## 2024-04-28 MED ORDER — BENZONATATE 100 MG PO CAPS: 100.0000 mg | ORAL_CAPSULE | Freq: Two times a day (BID) | ORAL | 0 refills | Status: DC | PRN

## 2024-04-28 MED ORDER — PREDNISONE 10 MG PO TABS: ORAL_TABLET | ORAL | 0 refills | Status: DC

## 2024-04-28 MED ORDER — ALBUTEROL SULFATE HFA 108 (90 BASE) MCG/ACT IN AERS: 2.0000 | INHALATION_SPRAY | Freq: Four times a day (QID) | RESPIRATORY_TRACT | 0 refills | Status: DC | PRN

## 2024-04-28 NOTE — Progress Notes (Signed)
 Established Patient Office Visit   Subjective  Patient ID: Michael Ramirez, male    DOB: 11-20-1980  Age: 43 y.o. MRN: [id number]  Chief Complaint  Patient presents with   Acute Visit    Patient came in for cough and congestion     Pt is a 43 yo male seen for acute concern.  Pt developed a cold 2 wks ago.  Overall symptoms resolved but cough, congestion, and wheezing have continued.  Cough worse with talking or laying down at night.  Cough is both dry and productive with yellow/grn phlegm.  Nose feels sensitive.    At last OFV given zoloft .  Taking 25 mg tabs daily.  At first noticed an increase in anxiety sx, but this has resolved.    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   Hemorrhoid 09/17/2016   Cluster headaches 10/13/2013   Past Medical History:  Diagnosis Date   Cluster headache    Past Surgical History:  Procedure Laterality Date   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 Right 02/28/2020   Social History   Tobacco Use   Smoking status: Former    Current packs/day: 0.00    Types: Cigarettes    Quit date: 11/25/2015    Years since quitting: 8.4   Smokeless tobacco: Former  Building services engineer status: Never Used  Substance Use Topics   Alcohol use: Yes    Alcohol/week: 6.0 standard drinks of alcohol    Types: 6 Shots of liquor per week    Comment: Fifth  every other weekend    Drug use: Yes    Types: Marijuana   Family History  Problem Relation Age of Onset   Hypertension Father    Anxiety disorder Father    Heart disease Paternal Grandmother    Cancer Paternal Grandfather    Allergies  Allergen Reactions   Hydrocodone -Acetaminophen  Rash   Oxycodone  Rash    ROS Negative unless stated above    Objective:     BP 124/82 (BP Location: Left Arm, Patient  Position: Sitting, Cuff Size: Normal)   Pulse 80   Temp 98.4 F (36.9 C) (Oral)   Ht 5' 7 (1.702 m)   Wt 172 lb (78 kg)   SpO2 98%   BMI 26.94 kg/m  BP Readings from Last 3 Encounters:  04/28/24 124/82  04/07/24 110/80  10/01/23 (!) 142/74   Wt Readings from Last 3 Encounters:  04/28/24 172 lb (78 kg)  04/07/24 170 lb 3.2 oz (77.2 kg)  10/01/23 170 lb 12.8 oz (77.5 kg)      Physical Exam Constitutional:      General: He is not in acute distress.    Appearance: Normal appearance.  HENT:     Head: Normocephalic and atraumatic.     Nose: Nose normal.     Mouth/Throat:     Mouth: Mucous membranes are moist.     Pharynx: Postnasal drip present. No pharyngeal swelling or posterior oropharyngeal erythema.  Cardiovascular:     Rate and Rhythm: Normal rate and regular rhythm.     Heart sounds: Normal heart sounds. No murmur heard.    No gallop.  Pulmonary:     Effort: Pulmonary effort is normal. No respiratory distress.     Breath sounds: Normal breath sounds. No wheezing, rhonchi or  rales.  Skin:    General: Skin is warm and dry.  Neurological:     Mental Status: He is alert and oriented to person, place, and time.        04/07/2024   10:33 AM 10/01/2023    9:46 AM 07/15/2023   10:17 AM  Depression screen PHQ 2/9  Decreased Interest 1 0 0  Down, Depressed, Hopeless 0 0 0  PHQ - 2 Score 1 0 0  Altered sleeping 1 1 3   Tired, decreased energy 0 1 0  Change in appetite 1 1 0  Feeling bad or failure about yourself  1 0 1  Trouble concentrating 1 0 1  Moving slowly or fidgety/restless 0 0 0  Suicidal thoughts 0 0 0  PHQ-9 Score 5 3 5   Difficult doing work/chores Not difficult at all Not difficult at all Somewhat difficult      04/07/2024   10:33 AM 10/01/2023    9:46 AM 07/15/2023   10:17 AM 10/02/2022   11:12 AM  GAD 7 : Generalized Anxiety Score  Nervous, Anxious, on Edge 1 0 2 1  Control/stop worrying 1 0 1 1  Worry too much - different things 1 0 1 1  Trouble  relaxing 0 0 1 2  Restless 0 0 1 1  Easily annoyed or irritable 1 1 2 1   Afraid - awful might happen 0 0 0 1  Total GAD 7 Score 4 1 8 8   Anxiety Difficulty Not difficult at all Not difficult at all Somewhat difficult Somewhat difficult     No results found for any visits on 04/28/24.    Assessment & Plan:   Bronchitis -     Benzonatate; Take 1 capsule (100 mg total) by mouth 2 (two) times daily as needed for cough.  Dispense: 20 capsule; Refill: 0 -     predniSONE; Take 5 tabs on day 1, 4 tabs on day 2, 3 tabs on day 3, 2 tabs on day 4, 1 tab on day 5.  Dispense: 15 tablet; Refill: 0 -     Amoxicillin -Pot Clavulanate; Take 1 tablet by mouth in the morning and at bedtime for 7 days.  Dispense: 14 tablet; Refill: 0 -     Albuterol Sulfate HFA; Inhale 2 puffs into the lungs every 6 (six) hours as needed for wheezing or shortness of breath.  Dispense: 8.5 g; Refill: 0  Post-nasal drainage  GAD (generalized anxiety disorder)  Postviral cough with wheezing and increased sputum production in a former smoker.  Tessalon for cough.  Prednisone taper and Augmentin .  Albuterol inhaler if needed for wheezing or shortness of breath.  Discussed other supportive care including antihistamine for postnasal drainage.  Given precautions.  Follow-up as needed for continued or worsening symptoms.  Patient reports improvement in anxiety.  Continue Zoloft  25 mg daily.  Follow-up in December as planned.  Return if symptoms worsen or fail to improve.   Clotilda JONELLE Single, MD

## 2024-04-29 ENCOUNTER — Other Ambulatory Visit: Payer: Self-pay | Admitting: Family Medicine

## 2024-04-29 DIAGNOSIS — F411 Generalized anxiety disorder: Secondary | ICD-10-CM

## 2024-05-16 DIAGNOSIS — Z0120 Encounter for dental examination and cleaning without abnormal findings: Secondary | ICD-10-CM | POA: Diagnosis not present

## 2024-05-23 ENCOUNTER — Telehealth: Payer: Self-pay | Admitting: Family Medicine

## 2024-05-23 NOTE — Telephone Encounter (Signed)
 Copied from CRM [id number]. Topic: Referral - Request for Referral >> May 23, 2024  2:21 PM Michael Ramirez wrote: Did the patient discuss referral with their provider in the last year? No (If No - schedule appointment)  Appointment offered? Yes  Type of order/referral and detailed reason for visit: sleep study  for sleep apnea   Preference of office, provider, location: he says whoever takes [facility]   If referral order, have you been seen by this specialty before? No (If Yes, this issue or another issue? When? Where?  Can we respond through MyChart? Yes

## 2024-05-25 ENCOUNTER — Ambulatory Visit (INDEPENDENT_AMBULATORY_CARE_PROVIDER_SITE_OTHER): Admitting: Family Medicine

## 2024-05-25 VITALS — BP 118/78 | HR 80 | Temp 98.2°F | Ht 67.0 in | Wt 174.6 lb

## 2024-05-25 DIAGNOSIS — R0683 Snoring: Secondary | ICD-10-CM

## 2024-05-25 DIAGNOSIS — F411 Generalized anxiety disorder: Secondary | ICD-10-CM

## 2024-05-25 DIAGNOSIS — F458 Other somatoform disorders: Secondary | ICD-10-CM

## 2024-05-25 DIAGNOSIS — J40 Bronchitis, not specified as acute or chronic: Secondary | ICD-10-CM | POA: Diagnosis not present

## 2024-05-25 MED ORDER — ALBUTEROL SULFATE HFA 108 (90 BASE) MCG/ACT IN AERS
2.0000 | INHALATION_SPRAY | Freq: Four times a day (QID) | RESPIRATORY_TRACT | 0 refills | Status: AC | PRN
Start: 2024-05-25 — End: ?

## 2024-05-25 MED ORDER — SERTRALINE HCL 50 MG PO TABS: 50.0000 mg | ORAL_TABLET | Freq: Every day | ORAL | 1 refills | Status: AC

## 2024-05-25 NOTE — Progress Notes (Unsigned)
 Established Patient Office Visit   Subjective  Patient ID: Michael Ramirez, male    DOB: [birth year]  Age: 43 y.o. MRN: [id number]  Chief Complaint  Patient presents with  . Acute Visit    Sleep apnea     Pt is a 43 yo male seen for f/u.  Pt states he was advised to f/u with pcp     Patient Active Problem List   Diagnosis Date Noted  . Prediabetes 08/09/2023  . History of hematuria 08/09/2023  . GAD (generalized anxiety disorder) 08/09/2023  . 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 Hemorrhoid 09/17/2016  . Cluster headaches 10/13/2013   Past Medical History:  Diagnosis Date  . Cluster headache    Past Surgical History:  Procedure Laterality Date  . 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 Right 02/28/2020   Social History   Tobacco Use  . Smoking status: Former    Current packs/day: 0.00    Types: Cigarettes    Quit date: 11/25/2015    Years since quitting: 8.5  . Smokeless tobacco: Former  Advertising Account Planner  . Vaping status: Never Used  Substance Use Topics  . Alcohol use: Yes    Alcohol/week: 6.0 standard drinks of alcohol    Types: 6 Shots of liquor per week    Comment: Fifth  every other weekend   . Drug use: Yes    Types: Marijuana   Family History  Problem Relation Age of Onset  . Hypertension Father   . Anxiety disorder Father   . Heart disease Paternal Grandmother   . Cancer Paternal Grandfather    Allergies  Allergen Reactions  . Hydrocodone -Acetaminophen  Rash  . Oxycodone  Rash    ROS Negative unless stated above    Objective:     BP 118/78 (BP Location: Left Arm, Patient Position: Sitting, Cuff Size: Large)   Pulse 80   Temp 98.2 F (36.8 C) (Oral)   Ht 5' 7 (1.702 m)   Wt 174 lb 9.6 oz (79.2 kg)   SpO2 98%   BMI 27.35 kg/m  BP Readings from Last 3 Encounters:  05/25/24 118/78  04/28/24 124/82  04/07/24 110/80   Wt Readings from Last 3 Encounters:  05/25/24 174 lb 9.6 oz (79.2 kg)  04/28/24  172 lb (78 kg)  04/07/24 170 lb 3.2 oz (77.2 kg)      Physical Exam Constitutional:      General: He is not in acute distress.    Appearance: Normal appearance.  HENT:     Head: Normocephalic and atraumatic.     Nose: Nose normal.     Mouth/Throat:     Mouth: Mucous membranes are moist.  Cardiovascular:     Rate and Rhythm: Normal rate and regular rhythm.     Heart sounds: Normal heart sounds. No murmur heard.    No gallop.  Pulmonary:     Effort: Pulmonary effort is normal. No respiratory distress.     Breath sounds: Normal breath sounds. No wheezing, rhonchi or rales.  Skin:    General: Skin is warm and dry.  Neurological:     Mental Status: He is alert and oriented to person, place, and time.        05/25/2024   11:30 AM 04/07/2024   10:33 AM 10/01/2023    9:46 AM  Depression screen PHQ 2/9  Decreased Interest 0 1 0  Down, Depressed, Hopeless 1 0 0  PHQ - 2 Score 1 1 0  Altered sleeping 2 1 1   Tired, decreased energy 0 0 1  Change in appetite 1 1 1   Feeling bad or failure about yourself  1 1 0  Trouble concentrating 1 1 0  Moving slowly or fidgety/restless 0 0 0  Suicidal thoughts 0 0 0  PHQ-9 Score 6  5  3    Difficult doing work/chores Not difficult at all Not difficult at all Not difficult at all     Data saved with a previous flowsheet row definition      05/25/2024   11:30 AM 04/07/2024   10:33 AM 10/01/2023    9:46 AM 07/15/2023   10:17 AM  GAD 7 : Generalized Anxiety Score  Nervous, Anxious, on Edge 1 1 0 2  Control/stop worrying 0 1 0 1  Worry too much - different things 1 1 0 1  Trouble relaxing 1 0 0 1  Restless 0 0 0 1  Easily annoyed or irritable 0 1 1 2   Afraid - awful might happen 1 0 0 0  Total GAD 7 Score 4 4 1 8   Anxiety Difficulty Not difficult at all Not difficult at all Not difficult at all Somewhat difficult    No results found for any visits on 05/25/24.    Assessment & Plan:   Bruxism  Bronchitis -     Albuterol Sulfate  HFA; Inhale 2 puffs into the lungs every 6 (six) hours as needed for wheezing or shortness of breath.  Dispense: 8.5 g; Refill: 0  GAD (generalized anxiety disorder) -     Sertraline  HCl; Take 1 tablet (50 mg total) by mouth daily.  Dispense: 90 tablet; Refill: 1  Snores -     Pulmonary Visit    ***Snoring and bruxism.  Concern for OSA.  Referral to [facility] for sleep study.  GAD 7 score 4 and PHQ 9 score 6.  Advised to take zoloft  50 mg daily consistently.  Self care encouraged including changes to diet and exercise.  Counseling encouraged.  No follow-ups on file.   Clotilda JONELLE Single, MD

## 2024-06-02 ENCOUNTER — Encounter: Payer: Self-pay | Admitting: Family Medicine

## 2024-06-09 DIAGNOSIS — F411 Generalized anxiety disorder: Secondary | ICD-10-CM | POA: Diagnosis not present

## 2024-06-14 ENCOUNTER — Ambulatory Visit (INDEPENDENT_AMBULATORY_CARE_PROVIDER_SITE_OTHER)

## 2024-06-14 VITALS — BP 122/64 | HR 81 | Temp 98.7°F | Ht 67.0 in | Wt 170.2 lb

## 2024-06-14 DIAGNOSIS — J301 Allergic rhinitis due to pollen: Secondary | ICD-10-CM | POA: Diagnosis not present

## 2024-06-14 DIAGNOSIS — R0683 Snoring: Secondary | ICD-10-CM

## 2024-06-14 DIAGNOSIS — G4763 Sleep related bruxism: Secondary | ICD-10-CM | POA: Diagnosis not present

## 2024-06-14 DIAGNOSIS — G4721 Circadian rhythm sleep disorder, delayed sleep phase type: Secondary | ICD-10-CM

## 2024-06-14 NOTE — Progress Notes (Signed)
 Pulmonology Office Visit   Subjective:  Patient ID: Michael Ramirez, male    DOB: July 24, 1981  MRN: [id number]  Referred by: Mercer Clotilda SAUNDERS, MD  CC:  Chief Complaint  Patient presents with   Consult    Snoring, wheezing during sleep.  Wakes up catching breath.  Grinding teeth and jaws get tired while chewing food.    HPI Michael Ramirez is a 43 y.o. male with cluster headaches, GAD on Zoloft  presents for evaluation for snoring and bruxism.SABRA  Respective notes from provider reviewed as appropriate to gather relevant information for patient care.   Discussed the use of AI scribe software for clinical note transcription with the patient, who gave verbal consent to proceed.  History of Present Illness   Michael Ramirez is a 43 year old male who presents with snoring and bruxism. He was referred by his primary care physician for evaluation of snoring and possible sleep apnea.  He experiences snoring and has been informed by his dentist about bruxism. He has woken up gasping a few times but does not feel in danger. He is unsure if he stops breathing during sleep but describes a heavy sensation occasionally. He feels tired during the day and often lays down. 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  His sleep schedule is irregular, often staying up until 2 or 3 AM and waking around 10:30 or 11 AM. He takes naps about twice a week for approximately two hours. He wakes up frequently during the night and experiences dry mouth in the morning. He used to have cluster headaches but has not experienced them in about a year and a half. He reports sleep talking and restless legs, with a constant urge to move his legs, especially when lying down, and experiences cramps in the bottom of his foot.  He drinks Red Bull in the morning and has recently picked up smoking again, though he was a heavier smoker in the past. He drinks alcohol mainly on weekends or  occasionally during the week, usually in the afternoon. He has seasonal allergies but does not use nasal sprays regularly.       PRIOR TESTS and IMAGING: NO Sleep study on file.      06/14/2024    3:00 PM  Results of the Epworth flowsheet  Sitting and reading 0  Watching TV 2  Sitting, inactive in a public place (e.g. a theatre or a meeting) 3  As a passenger in a car for an hour without a break 1  Lying down to rest in the afternoon when circumstances permit 3  Sitting and talking to someone 0  Sitting quietly after a lunch without alcohol 1  In a car, while stopped for a few minutes in traffic 0  Total score 10    Allergies: Hydrocodone -acetaminophen  and Oxycodone   Current Outpatient Medications:    albuterol  (VENTOLIN  HFA) 108 (90 Base) MCG/ACT inhaler, Inhale 2 puffs into the lungs every 6 (six) hours as needed for wheezing or shortness of breath., Disp: 8.5 g, Rfl: 0   sertraline  (ZOLOFT ) 50 MG tablet, Take 1 tablet (50 mg total) by mouth daily., Disp: 90 tablet, Rfl: 1 Past Medical History:  Diagnosis Date   Cluster headache    Past Surgical History:  Procedure Laterality Date   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 Right 02/28/2020   Family History  Problem Relation Age of Onset   Hypertension Father  Anxiety disorder Father    Heart disease Paternal Grandmother    Cancer Paternal Grandfather    Social History   Socioeconomic History   Marital status: Single    Spouse name: Not on file   Number of children: Not on file   Years of education: Not on file   Highest education level: GED or equivalent  Occupational History   Occupation: Midwife: [employer]  Tobacco Use   Smoking status: Former    Current packs/day: 0.00    Types: Cigarettes    Quit date: 11/25/2015    Years since quitting: 8.5   Smokeless tobacco: Former  Building Services Engineer status: Never Used  Substance and Sexual Activity   Alcohol  use: Yes    Alcohol/week: 6.0 standard drinks of alcohol    Types: 6 Shots of liquor per week    Comment: Fifth  every other weekend    Drug use: Yes    Types: Marijuana   Sexual activity: Yes    Partners: Female    Birth control/protection: Condom  Other Topics Concern   Not on file  Social History Narrative   Not on file   Social Drivers of Health   Financial Resource Strain: Low Risk  (05/25/2024)   Overall Financial Resource Strain (CARDIA)    Difficulty of Paying Living Expenses: Not very hard  Food Insecurity: No Food Insecurity (05/25/2024)   Hunger Vital Sign    Worried About Running Out of Food in the Last Year: Never true    Ran Out of Food in the Last Year: Never true  Transportation Needs: No Transportation Needs (05/25/2024)   PRAPARE - Administrator, Civil Service (Medical): No    Lack of Transportation (Non-Medical): No  Physical Activity: Insufficiently Active (05/25/2024)   Exercise Vital Sign    Days of Exercise per Week: 4 days    Minutes of Exercise per Session: 30 min  Stress: Stress Concern Present (05/25/2024)   [employer] of Occupational Health - Occupational Stress Questionnaire    Feeling of Stress: Rather much  Social Connections: Socially Isolated (05/25/2024)   Social Connection and Isolation Panel    Frequency of Communication with Friends and Family: Once a week    Frequency of Social Gatherings with Friends and Family: Three times a week    Attends Religious Services: Patient declined    Active Member of Clubs or Organizations: No    Attends Engineer, Structural: Not on file    Marital Status: Never married  Intimate Partner Violence: Not on file       Objective:  BP 122/64   Pulse 81   Temp 98.7 F (37.1 C) (Oral)   Ht 5' 7 (1.702 m)   Wt 170 lb 3.2 oz (77.2 kg)   SpO2 99% Comment: room air  BMI 26.66 kg/m  BMI Readings from Last 3 Encounters:  06/14/24 26.66 kg/m  05/25/24 27.35 kg/m  04/28/24  26.94 kg/m    Physical Exam: Physical Exam   ENT: Normal mucosa. Nasal turbinates boggy.. Tonsils are normal sized. Modified Mallampati score is 4. PULMONARY: Lungs clear to auscultation bilaterally, no adventitious breath sounds. CARDIOVASCULAR: Regular rate and rhythm, S1 S2 normal, no murmurs. ABDOMEN: Abdomen soft, nontender. Bowel sounds are normal. EXTREMITIES: No peripheral edema noted.       Diagnostic Review:  Last metabolic panel Lab Results  Component Value Date   GLUCOSE 92 07/15/2023   NA 138 07/15/2023  K 4.1 07/15/2023   CL 101 07/15/2023   CO2 29 07/15/2023   BUN 14 07/15/2023   CREATININE 1.44 07/15/2023   GFR 60.27 07/15/2023   CALCIUM 9.5 07/15/2023   PROT 7.1 07/15/2023   ALBUMIN 4.5 07/15/2023   BILITOT 0.7 07/15/2023   ALKPHOS 66 07/15/2023   AST 17 07/15/2023   ALT 22 07/15/2023   ANIONGAP 8 07/10/2021         Assessment & Plan:   Assessment & Plan Snoring     Bruxism, sleep-related     Delayed sleep phase syndrome     Allergic rhinitis due to pollen, unspecified seasonality        Assessment and Plan I discussed with the patient the pathophysiology of obstructive sleep apnea, its association with weight, and its negative effects on hypertension, diabetes, mental health, A-fib, stroke if left untreated.  I briefly discussed the treatment options for obstructive sleep apnea     Snoring and sleep related bruxism Chronic snoring and bruxism with symptoms suggestive of sleep apnea. Differential includes sleep apnea, potentially contributing to symptoms. Anxiety may be exacerbated by sleep apnea. - Ordered sleep study to evaluate for sleep apnea. - Discussed potential use of CPAP if sleep apnea is confirmed. - Discussed potential use of dental device for mild to moderate sleep apnea. - Recommended use of saline nasal spray and Flonase for nasal congestion.  Delayed sleep phase circadian rhythm disorder Irregular sleep schedule  with late bedtimes and wake times, consistent with delayed sleep phase circadian rhythm disorder. - ok to continue with this sleep phase provided it does not affect him socially.   Allergic rhinitis due to pollen Seasonal allergic rhinitis with nasal congestion, potentially contributing to snoring and sleep disturbances. - Recommended saline nasal spray for nasal congestion. - Prescribed Flonase, two squirts in each nostril for the first seven days, then one squirt daily.         Notes from PCP on 05/25/24 reviewed as to gather relevant information for patient care and formulating plan.  He  was counselled about not driving while drowsy which is common side effect of sleep related disorders.   Return for 1 mnth after Sleep study.   I personally spent a total of 30 minutes in the care of the patient today including preparing to see the patient, getting/reviewing separately obtained history, performing a medically appropriate exam/evaluation, counseling and educating, placing orders, documenting clinical information in the EHR, independently interpreting results, and communicating results.   Gabino Hagin, MD

## 2024-06-14 NOTE — Addendum Note (Signed)
 Addended by: Aquan Kope D on: 06/14/2024 03:55 PM   Modules accepted: Orders

## 2024-06-14 NOTE — Patient Instructions (Signed)
  VISIT SUMMARY: Today, you were seen for concerns about snoring and teeth grinding during sleep. We discussed your symptoms, including your irregular sleep schedule, daytime tiredness, and history of seasonal allergies. A sleep study was ordered to evaluate for possible sleep apnea, and we reviewed potential treatments and lifestyle recommendations.  YOUR PLAN: -SNORING AND SLEEP RELATED BRUXISM: Snoring and teeth grinding during sleep can be signs of sleep apnea, a condition where breathing repeatedly stops and starts during sleep. We have ordered a sleep study to check for sleep apnea. If confirmed, we may consider treatments like a CPAP machine or a dental device. For nasal congestion, use saline nasal spray and Flonase as directed.  -DELAYED SLEEP PHASE CIRCADIAN RHYTHM DISORDER: Your irregular sleep schedule, with late bedtimes and wake times, suggests a delayed sleep phase circadian rhythm disorder, which means your internal body clock is shifted later than normal.  -ALLERGIC RHINITIS DUE TO POLLEN: Seasonal allergies can cause nasal congestion, which may contribute to snoring and sleep disturbances. Use saline nasal spray and Flonase as directed to help manage your symptoms.                   Contains text generated by Abridge.                                 Contains text generated by Abridge.

## 2024-06-15 DIAGNOSIS — F411 Generalized anxiety disorder: Secondary | ICD-10-CM | POA: Diagnosis not present

## 2024-06-29 DIAGNOSIS — F411 Generalized anxiety disorder: Secondary | ICD-10-CM | POA: Diagnosis not present

## 2024-07-07 DIAGNOSIS — F411 Generalized anxiety disorder: Secondary | ICD-10-CM | POA: Diagnosis not present

## 2024-07-14 DIAGNOSIS — F411 Generalized anxiety disorder: Secondary | ICD-10-CM | POA: Diagnosis not present

## 2024-08-01 ENCOUNTER — Ambulatory Visit

## 2024-08-01 DIAGNOSIS — G4763 Sleep related bruxism: Secondary | ICD-10-CM

## 2024-08-01 DIAGNOSIS — R0683 Snoring: Secondary | ICD-10-CM

## 2024-08-02 ENCOUNTER — Telehealth: Payer: Self-pay

## 2024-08-02 DIAGNOSIS — G4733 Obstructive sleep apnea (adult) (pediatric): Secondary | ICD-10-CM | POA: Diagnosis not present

## 2024-08-02 NOTE — Telephone Encounter (Signed)
 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   Plan: Please schedule follow-up with me to discuss results. Side sleep.   Sammi Fredericks, MD.

## 2024-08-03 NOTE — Telephone Encounter (Signed)
 Called and got patient scheduled for a visit on 1/22 with  [email or web address]  PCC what should he do with his watchpat thing he used,he says he still has it,toldhim you guys would reach out or I can just didn't want to relay false info

## 2024-08-03 NOTE — Telephone Encounter (Signed)
 ATC LVM informing pt he could toss the WatchPat. NFN

## 2024-08-17 ENCOUNTER — Ambulatory Visit

## 2024-08-17 VITALS — BP 121/68 | HR 75 | Temp 98.4°F | Ht 67.0 in | Wt 170.6 lb

## 2024-08-17 DIAGNOSIS — G4763 Sleep related bruxism: Secondary | ICD-10-CM | POA: Diagnosis not present

## 2024-08-17 DIAGNOSIS — Z87891 Personal history of nicotine dependence: Secondary | ICD-10-CM | POA: Diagnosis not present

## 2024-08-17 DIAGNOSIS — G4733 Obstructive sleep apnea (adult) (pediatric): Secondary | ICD-10-CM | POA: Diagnosis not present

## 2024-08-17 DIAGNOSIS — J301 Allergic rhinitis due to pollen: Secondary | ICD-10-CM

## 2024-08-17 DIAGNOSIS — G4721 Circadian rhythm sleep disorder, delayed sleep phase type: Secondary | ICD-10-CM

## 2024-08-17 NOTE — Patient Instructions (Signed)
 We did discuss about need for compliance to meet insurance requirement.  The patient has to use CPAP for at least 5 out of 7 days in a week for a consecutive month within the first 3 months after getting CPAP.   VISIT SUMMARY:  During your visit, we discussed your recent sleep study results and symptoms related to sleep apnea, as well as your history of cluster headaches and sleep-related bruxism.  YOUR PLAN:  -OBSTRUCTIVE SLEEP APNEA: Obstructive sleep apnea is a condition where your airway becomes blocked during sleep, causing breathing interruptions. To address this, we have recommended CPAP therapy, which involves using a machine to keep your airway open. You should use the CPAP machine with a hybrid mask at least 5 days a week for 4 hours per night for a month. Clean the machine every 1-2 weeks and try to sleep on your side. We will follow up in 1-1.5 months after you receive the CPAP machine to assess your progress.  -SLEEP-RELATED BRUXISM: Sleep-related bruxism is the grinding of teeth during sleep. Using the CPAP machine may help reduce this issue. can get top-only mouthguard to prevent grinding.   INSTRUCTIONS:  Please follow up in 1-1.5 months after you receive the CPAP machine to assess your progress.    Contains text generated by Abridge.

## 2024-08-17 NOTE — Progress Notes (Signed)
 "  Pulmonology Office Visit   Subjective:  Patient ID: Michael Ramirez, male    DOB: [birth year]  MRN: [id number]  Referred by: Mercer Clotilda SAUNDERS, MD  CC:  Chief Complaint  Patient presents with   Follow-up    Discuss sleep test from 08/01/2024    HPI Michael Ramirez is a 44 y.o. male with cluster headaches, bruxism, GAD on Zoloft .  Follows for OSA.  Respective notes from provider reviewed as appropriate to gather relevant information for patient care.   Discussed the use of AI scribe software for clinical note transcription with the patient, who gave verbal consent to proceed.  History of Present Illness   Michael Ramirez is a 44 year old male who presents with sleep apnea and associated symptoms.  He recently underwent a sleep study that indicated mild to moderate sleep apnea. His primary symptoms include snoring and daytime fatigue. He also experiences occasional wheezing and dry mouth in the morning, which he attributes to sleeping with his mouth open.  He has a history of cluster headaches, which were frequent between 2010 and 2012, lasting for about three years. These headaches recurred almost two years ago but resolved within a month and have not returned since.  He reports bruxism during sleep, which is a concern due to previous issues with teeth shifting. He is cautious about using devices that might exacerbate this problem.       PRIOR TESTS and IMAGING: HST 08/02/24-3% 13.9, 4% 5.4.  O2 nadir 89.     06/14/2024    3:00 PM  Results of the Epworth flowsheet  Sitting and reading 0  Watching TV 2  Sitting, inactive in a public place (e.g. a theatre or a meeting) 3  As a passenger in a car for an hour without a break 1  Lying down to rest in the afternoon when circumstances permit 3  Sitting and talking to someone 0  Sitting quietly after a lunch without alcohol 1  In a car, while stopped for a few minutes in traffic 0  Total score 10    Allergies:  Hydrocodone -acetaminophen  and Oxycodone   Current Outpatient Medications:    albuterol  (VENTOLIN  HFA) 108 (90 Base) MCG/ACT inhaler, Inhale 2 puffs into the lungs every 6 (six) hours as needed for wheezing or shortness of breath., Disp: 8.5 g, Rfl: 0   sertraline  (ZOLOFT ) 50 MG tablet, Take 1 tablet (50 mg total) by mouth daily., Disp: 90 tablet, Rfl: 1 Past Medical History:  Diagnosis Date   Cluster headache    Past Surgical History:  Procedure Laterality Date   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 Right 02/28/2020   Family History  Problem Relation Age of Onset   Hypertension Father    Anxiety disorder Father    Heart disease Paternal Grandmother    Cancer Paternal Grandfather    Social History   Socioeconomic History   Marital status: Single    Spouse name: Not on file   Number of children: Not on file   Years of education: Not on file   Highest education level: GED or equivalent  Occupational History   Occupation: Midwife: [employer]  Tobacco Use   Smoking status: Former    Current packs/day: 0.00    Types: Cigarettes    Quit date: 11/25/2015    Years since quitting: 8.7   Smokeless tobacco: Former  Building Services Engineer status: Never Used  Substance and Sexual  Activity   Alcohol use: Yes    Alcohol/week: 6.0 standard drinks of alcohol    Types: 6 Shots of liquor per week    Comment: Fifth  every other weekend    Drug use: Yes    Types: Marijuana   Sexual activity: Yes    Partners: Female    Birth control/protection: Condom  Other Topics Concern   Not on file  Social History Narrative   Not on file   Social Drivers of Health   Tobacco Use: Medium Risk (08/17/2024)   Patient History    Smoking Tobacco Use: Former    Smokeless Tobacco Use: Former    Passive Exposure: Not on Actuary Strain: Low Risk (05/25/2024)   Overall Financial Resource Strain (CARDIA)    Difficulty of Paying Living  Expenses: Not very hard  Food Insecurity: No Food Insecurity (05/25/2024)   Epic    Worried About Programme Researcher, Broadcasting/film/video in the Last Year: Never true    Ran Out of Food in the Last Year: Never true  Transportation Needs: No Transportation Needs (05/25/2024)   Epic    Lack of Transportation (Medical): No    Lack of Transportation (Non-Medical): No  Physical Activity: Insufficiently Active (05/25/2024)   Exercise Vital Sign    Days of Exercise per Week: 4 days    Minutes of Exercise per Session: 30 min  Stress: Stress Concern Present (05/25/2024)   [employer] of Occupational Health - Occupational Stress Questionnaire    Feeling of Stress: Rather much  Social Connections: Socially Isolated (05/25/2024)   Social Connection and Isolation Panel    Frequency of Communication with Friends and Family: Once a week    Frequency of Social Gatherings with Friends and Family: Three times a week    Attends Religious Services: Patient declined    Active Member of Clubs or Organizations: No    Attends Banker Meetings: Not on file    Marital Status: Never married  Intimate Partner Violence: Not on file  Depression (PHQ2-9): Medium Risk (05/25/2024)   Depression (PHQ2-9)    PHQ-2 Score: 6  Alcohol Screen: Low Risk (05/25/2024)   Alcohol Screen    Last Alcohol Screening Score (AUDIT): 4  Housing: High Risk (05/25/2024)   Epic    Unable to Pay for Housing in the Last Year: Yes    Number of Times Moved in the Last Year: 1    Homeless in the Last Year: No  Utilities: Not on file  Health Literacy: Not on file       Objective:  BP 121/68   Pulse 75   Temp 98.4 F (36.9 C) (Temporal)   Ht 5' 7 (1.702 m)   Wt 170 lb 9.6 oz (77.4 kg)   SpO2 97% Comment: room air  BMI 26.72 kg/m  BMI Readings from Last 3 Encounters:  08/17/24 26.72 kg/m  06/14/24 26.66 kg/m  05/25/24 27.35 kg/m    Physical Exam: Physical Exam   ENT: Normal mucosa. No hypertrophy of inferior  turbinates. Tonsils are normal sized. Modified Mallampati score is normal. PULMONARY: Lungs clear to auscultation bilaterally, no adventitious breath sounds. CARDIOVASCULAR: Regular rate and rhythm, S1 S2 normal, no murmurs. ABDOMEN: Abdomen soft, nontender. Bowel sounds are normal. EXTREMITIES: No peripheral edema noted.       Diagnostic Review:  Last metabolic panel Lab Results  Component Value Date   GLUCOSE 92 07/15/2023   NA 138 07/15/2023   K 4.1 07/15/2023   CL  101 07/15/2023   CO2 29 07/15/2023   BUN 14 07/15/2023   CREATININE 1.44 07/15/2023   GFR 60.27 07/15/2023   CALCIUM 9.5 07/15/2023   PROT 7.1 07/15/2023   ALBUMIN 4.5 07/15/2023   BILITOT 0.7 07/15/2023   ALKPHOS 66 07/15/2023   AST 17 07/15/2023   ALT 22 07/15/2023   ANIONGAP 8 07/10/2021         Assessment & Plan:   Assessment & Plan OSA (obstructive sleep apnea)     Bruxism, sleep-related     Delayed sleep phase syndrome Patient happy with current sleep schedule.  No changes needed.    Allergic rhinitis due to pollen, unspecified seasonality        Assessment and Plan     Obstructive sleep apnea Mild obstructive sleep apnea with snoring, daytime fatigue, and occasional wheezing. CPAP therapy recommended to prevent cluster headache exacerbation and improve sleep quality. CPAP preferred over mandibular advancement device due to potential teeth shifting and jaw pain. - Ordered CPAP machine with hybrid mask. - Instructed to use CPAP at least 5 days a week for 4 hours per night for a month.  - Advised to clean CPAP machine every 1-2 weeks. - Recommended side sleeping. - Scheduled follow-up in 1-1.5 months after CPAP receipt.  Sleep-related bruxism Teeth grinding during sleep. CPAP may alleviate symptoms; if not, a top-only mouthguard can prevent grinding without teeth shifting. - OTC mouth guard.       He  was counselled about not driving while drowsy which is common side effect of  sleep related disorders.   Return for 1 month after CPAP setup.   I personally spent a total of 30 minutes in the care of the patient today including preparing to see the patient, getting/reviewing separately obtained history, performing a medically appropriate exam/evaluation, counseling and educating, placing orders, documenting clinical information in the EHR, independently interpreting results, and communicating results.   Sammi Fredericks, MD "

## 2024-08-17 NOTE — Addendum Note (Signed)
 Addended by: Sumedha Munnerlyn M on: 08/17/2024 03:53 PM   Modules accepted: Orders

## 2024-08-31 ENCOUNTER — Telehealth: Payer: Self-pay

## 2024-08-31 NOTE — Telephone Encounter (Signed)
 Received CMN from [facility].  Will fax back signed form to [phone]

## 2024-09-19 ENCOUNTER — Ambulatory Visit
